# Patient Record
Sex: Female | Born: 1965 | Race: Black or African American | Hispanic: No | Marital: Married | Smoking: Never smoker
Health system: Southern US, Community
[De-identification: ages and names within clinical notes are randomized; demographics above are authoritative.]

## PROBLEM LIST (undated history)

## (undated) DIAGNOSIS — J302 Other seasonal allergic rhinitis: Secondary | ICD-10-CM

## (undated) DIAGNOSIS — E7800 Pure hypercholesterolemia, unspecified: Secondary | ICD-10-CM

## (undated) DIAGNOSIS — I10 Essential (primary) hypertension: Secondary | ICD-10-CM

## (undated) HISTORY — PX: ABDOMINAL HYSTERECTOMY: SHX81

## (undated) HISTORY — PX: TUBAL LIGATION: SHX77

---

## 2011-10-22 ENCOUNTER — Emergency Department (HOSPITAL_COMMUNITY)
Admission: EM | Admit: 2011-10-22 | Discharge: 2011-10-23 | Disposition: A | Discharge status: Home / Self Care | Payer: Self-pay | Attending: Emergency Medicine | Admitting: Emergency Medicine

## 2011-10-22 ENCOUNTER — Encounter (HOSPITAL_COMMUNITY): Payer: Self-pay | Admitting: Emergency Medicine

## 2011-10-22 DIAGNOSIS — Y9229 Other specified public building as the place of occurrence of the external cause: Secondary | ICD-10-CM | POA: Insufficient documentation

## 2011-10-22 DIAGNOSIS — E119 Type 2 diabetes mellitus without complications: Secondary | ICD-10-CM | POA: Insufficient documentation

## 2011-10-22 DIAGNOSIS — Z79899 Other long term (current) drug therapy: Secondary | ICD-10-CM | POA: Insufficient documentation

## 2011-10-22 DIAGNOSIS — IMO0002 Reserved for concepts with insufficient information to code with codable children: Secondary | ICD-10-CM | POA: Insufficient documentation

## 2011-10-22 DIAGNOSIS — E7800 Pure hypercholesterolemia, unspecified: Secondary | ICD-10-CM | POA: Insufficient documentation

## 2011-10-22 DIAGNOSIS — S0003XA Contusion of scalp, initial encounter: Secondary | ICD-10-CM | POA: Insufficient documentation

## 2011-10-22 DIAGNOSIS — Z794 Long term (current) use of insulin: Secondary | ICD-10-CM | POA: Insufficient documentation

## 2011-10-22 DIAGNOSIS — S1093XA Contusion of unspecified part of neck, initial encounter: Secondary | ICD-10-CM | POA: Insufficient documentation

## 2011-10-22 DIAGNOSIS — S0093XA Contusion of unspecified part of head, initial encounter: Secondary | ICD-10-CM

## 2011-10-22 HISTORY — DX: Pure hypercholesterolemia, unspecified: E78.00

## 2011-10-22 MED ORDER — HYDROCODONE-ACETAMINOPHEN 5-325 MG PO TABS
1.0000 | ORAL_TABLET | Freq: Once | ORAL | Status: AC
  Administered 2011-10-22: 1 via ORAL
  Filled 2011-10-22: qty 1

## 2011-10-22 NOTE — ED Notes (Signed)
PA to bedside.  Pt rolled off of board with PA and another RN while maintaining c-spine.  Pt remains in c-collar.

## 2011-10-22 NOTE — ED Provider Notes (Signed)
History     CSN: [id number]  Arrival date & time 10/22/11  2216   First MD Initiated Contact with Patient 10/22/11 2229      Chief Complaint  Patient presents with  . Head Injury    Pt was leaving store and hit head on metal gate.  Pt having pain in head.  Pt having rt shoulder pain and lt lower back pain as well.    (Consider location/radiation/quality/duration/timing/severity/associated sxs/prior treatment) Patient is a 46 y.o. female presenting with head injury. The history is provided by the patient.  Head Injury  The incident occurred 1 to 2 hours ago. She came to the ER via EMS. Injury mechanism: She walked into a storefront gate that was being lowered.  There was no loss of consciousness. There was no blood loss. The quality of the pain is described as sharp. The pain has been constant since the injury. Associated symptoms comments: She denies fall after hitting her head. No LOC, nausea. She complains of pain in the right side of her head going into her shoulder. . She was found conscious by EMS personnel. Treatment on the scene included a backboard and a c-collar.    Past Medical History  Diagnosis Date  . Diabetes mellitus   . High cholesterol     Past Surgical History  Procedure Date  . Abdominal hysterectomy     No family history on file.  History  Substance Use Topics  . Smoking status: Never Smoker   . Smokeless tobacco: Not on file  . Alcohol Use: No    OB History    Grav Para Term Preterm Abortions TAB SAB Ect Mult Living                  Review of Systems  Constitutional: Negative for fever and chills.  HENT: Negative.   Respiratory: Negative.   Cardiovascular: Negative.   Gastrointestinal: Negative.  Negative for abdominal pain.  Musculoskeletal: Negative.   Skin: Negative.   Neurological: Negative.     Allergies  Penicillins  Home Medications   Current Outpatient Rx  Name Route Sig Dispense Refill  . ESTRADIOL 1 MG PO TABS Oral Take 5  mg by mouth daily.    Marland Kitchen GLIPIZIDE 5 MG PO TABS Oral Take 5 mg by mouth daily.    . INSULIN LISPRO PROT & LISPRO (75-25) 100 UNIT/ML [place] SUSP Subcutaneous Inject 30 Units into the skin 2 (two) times daily with a meal.    . LISINOPRIL PO Oral Take 1 tablet by mouth daily.    Marland Kitchen SIMVASTATIN PO Oral Take 1 tablet by mouth daily.      BP 150/97  Pulse 93  Temp(Src) 97.9 F (36.6 C) (Oral)  Resp 28  SpO2 99%  Physical Exam  Constitutional: She appears well-developed and well-nourished.  HENT:  Head: Normocephalic and atraumatic.  Neck: Normal range of motion. Neck supple.       No cervical spinal tenderness to palpation. Mild right paraspinal tenderness without swelling.   Cardiovascular: Normal rate and regular rhythm.   Pulmonary/Chest: Effort normal and breath sounds normal. She exhibits no tenderness.  Abdominal: Soft. Bowel sounds are normal. There is no tenderness. There is no rebound and no guarding.  Musculoskeletal: Normal range of motion. She exhibits no edema.  Neurological: She is alert. No cranial nerve deficit.  Skin: Skin is warm and dry. No rash noted.  Psychiatric: She has a normal mood and affect.    ED Course  Procedures (  including critical care time)  Labs Reviewed - No data to display No results found.  Ct Head Wo Contrast  10/23/2011  *RADIOLOGY REPORT*  Clinical Data:  Pain after injury, striking head on a metal date.  CT HEAD WITHOUT CONTRAST CT CERVICAL SPINE WITHOUT CONTRAST  Technique:  Multidetector CT imaging of the head and cervical spine was performed following the standard protocol without intravenous contrast.  Multiplanar CT image reconstructions of the cervical spine were also generated.  Comparison:   None  CT HEAD  Findings: Ventricles and sulci appear symmetrical.  No mass effect or midline shift.  No ventricular dilatation.  No abnormal extra- axial fluid collections.  Gray-white matter junctions are distinct. Basal cisterns are not effaced.  No  evidence of acute intracranial hemorrhage.  No depressed skull fractures.  Visualized paranasal sinuses and mastoid air cells are not opacified.  Minimal mucosal membrane thickening in the left maxillary antrum.  IMPRESSION: No acute intracranial abnormality demonstrated.  CT CERVICAL SPINE  Findings: Normal alignment of the cervical vertebrae and facet joints.  Degenerative changes at C1-2.  Degenerative endplate hypertrophic changes at C3-4, C4-5, and C5-6 levels. Intervertebral disc space heights are mostly preserved.  No vertebral compression deformities.  No prevertebral soft tissue swelling.  Lateral masses of C1 appear symmetrical.  The odontoid process is intact.  No paraspinal soft tissue infiltration. Visualized cervical lymph nodes are not pathologically enlarged.  IMPRESSION: No displaced fractures identified.  Degenerative changes.  Original Report Authenticated By: Marlon Pel, M.D.   Ct Cervical Spine Wo Contrast  10/23/2011  *RADIOLOGY REPORT*  Clinical Data:  Pain after injury, striking head on a metal date.  CT HEAD WITHOUT CONTRAST CT CERVICAL SPINE WITHOUT CONTRAST  Technique:  Multidetector CT imaging of the head and cervical spine was performed following the standard protocol without intravenous contrast.  Multiplanar CT image reconstructions of the cervical spine were also generated.  Comparison:   None  CT HEAD  Findings: Ventricles and sulci appear symmetrical.  No mass effect or midline shift.  No ventricular dilatation.  No abnormal extra- axial fluid collections.  Gray-white matter junctions are distinct. Basal cisterns are not effaced.  No evidence of acute intracranial hemorrhage.  No depressed skull fractures.  Visualized paranasal sinuses and mastoid air cells are not opacified.  Minimal mucosal membrane thickening in the left maxillary antrum.  IMPRESSION: No acute intracranial abnormality demonstrated.  CT CERVICAL SPINE  Findings: Normal alignment of the cervical  vertebrae and facet joints.  Degenerative changes at C1-2.  Degenerative endplate hypertrophic changes at C3-4, C4-5, and C5-6 levels. Intervertebral disc space heights are mostly preserved.  No vertebral compression deformities.  No prevertebral soft tissue swelling.  Lateral masses of C1 appear symmetrical.  The odontoid process is intact.  No paraspinal soft tissue infiltration. Visualized cervical lymph nodes are not pathologically enlarged.  IMPRESSION: No displaced fractures identified.  Degenerative changes.  Original Report Authenticated By: Marlon Pel, M.D.   1. Contusion of head     Medical screening examination/treatment/procedure(s) were performed by non-physician practitioner and as supervising physician I was immediately available for consultation/collaboration. Osvaldo Human, M.D.      Rodena Medin, PA-C 10/23/11 0058  Carleene Cooper III, MD 10/23/11 [phone]

## 2011-10-23 ENCOUNTER — Emergency Department (HOSPITAL_COMMUNITY): Payer: Self-pay

## 2011-10-23 MED ORDER — IBUPROFEN 800 MG PO TABS: 800.0000 mg | ORAL_TABLET | Freq: Three times a day (TID) | ORAL | Status: AC

## 2011-10-23 MED ORDER — CYCLOBENZAPRINE HCL 10 MG PO TABS: 10.0000 mg | ORAL_TABLET | Freq: Two times a day (BID) | ORAL | Status: AC | PRN

## 2011-10-23 NOTE — ED Notes (Addendum)
Heard pt scream help from the hallway, upon entering the room pt was found to be hyperventilating, very anxious stating "I can't breath".  Ensured pt that her oxygn saturation was very good (100%) and instructed her on controlling her breathing. Radiology at bedside to take pt for xray.

## 2011-10-23 NOTE — ED Notes (Signed)
Pt report given and care endorsed to [place], RN 

## 2013-02-10 ENCOUNTER — Emergency Department (HOSPITAL_BASED_OUTPATIENT_CLINIC_OR_DEPARTMENT_OTHER)
Admission: EM | Admit: 2013-02-10 | Discharge: 2013-02-10 | Disposition: A | Discharge status: Home / Self Care | Payer: Self-pay | Attending: Emergency Medicine | Admitting: Emergency Medicine

## 2013-02-10 ENCOUNTER — Encounter (HOSPITAL_BASED_OUTPATIENT_CLINIC_OR_DEPARTMENT_OTHER): Payer: Self-pay | Admitting: *Deleted

## 2013-02-10 DIAGNOSIS — Z880 Allergy status to penicillin: Secondary | ICD-10-CM | POA: Insufficient documentation

## 2013-02-10 DIAGNOSIS — Z9119 Patient's noncompliance with other medical treatment and regimen: Secondary | ICD-10-CM | POA: Insufficient documentation

## 2013-02-10 DIAGNOSIS — R358 Other polyuria: Secondary | ICD-10-CM | POA: Insufficient documentation

## 2013-02-10 DIAGNOSIS — Z9114 Patient's other noncompliance with medication regimen: Secondary | ICD-10-CM

## 2013-02-10 DIAGNOSIS — R5383 Other fatigue: Secondary | ICD-10-CM | POA: Insufficient documentation

## 2013-02-10 DIAGNOSIS — R739 Hyperglycemia, unspecified: Secondary | ICD-10-CM

## 2013-02-10 DIAGNOSIS — Z794 Long term (current) use of insulin: Secondary | ICD-10-CM | POA: Insufficient documentation

## 2013-02-10 DIAGNOSIS — E7800 Pure hypercholesterolemia, unspecified: Secondary | ICD-10-CM | POA: Insufficient documentation

## 2013-02-10 DIAGNOSIS — R5381 Other malaise: Secondary | ICD-10-CM | POA: Insufficient documentation

## 2013-02-10 DIAGNOSIS — R3589 Other polyuria: Secondary | ICD-10-CM | POA: Insufficient documentation

## 2013-02-10 DIAGNOSIS — R109 Unspecified abdominal pain: Secondary | ICD-10-CM | POA: Insufficient documentation

## 2013-02-10 DIAGNOSIS — Z79899 Other long term (current) drug therapy: Secondary | ICD-10-CM | POA: Insufficient documentation

## 2013-02-10 DIAGNOSIS — R631 Polydipsia: Secondary | ICD-10-CM | POA: Insufficient documentation

## 2013-02-10 DIAGNOSIS — R110 Nausea: Secondary | ICD-10-CM | POA: Insufficient documentation

## 2013-02-10 DIAGNOSIS — Z90710 Acquired absence of both cervix and uterus: Secondary | ICD-10-CM | POA: Insufficient documentation

## 2013-02-10 DIAGNOSIS — R42 Dizziness and giddiness: Secondary | ICD-10-CM | POA: Insufficient documentation

## 2013-02-10 DIAGNOSIS — E1169 Type 2 diabetes mellitus with other specified complication: Secondary | ICD-10-CM | POA: Insufficient documentation

## 2013-02-10 DIAGNOSIS — I10 Essential (primary) hypertension: Secondary | ICD-10-CM | POA: Insufficient documentation

## 2013-02-10 DIAGNOSIS — Z9089 Acquired absence of other organs: Secondary | ICD-10-CM | POA: Insufficient documentation

## 2013-02-10 DIAGNOSIS — Z91199 Patient's noncompliance with other medical treatment and regimen due to unspecified reason: Secondary | ICD-10-CM | POA: Insufficient documentation

## 2013-02-10 HISTORY — DX: Essential (primary) hypertension: I10

## 2013-02-10 HISTORY — DX: Other seasonal allergic rhinitis: J30.2

## 2013-02-10 LAB — COMPREHENSIVE METABOLIC PANEL
ALT: 26 U/L (ref 0–35)
AST: 38 U/L — ABNORMAL HIGH (ref 0–37)
Albumin: 3.8 g/dL (ref 3.5–5.2)
Calcium: 10.2 mg/dL (ref 8.4–10.5)
Creatinine, Ser: 0.7 mg/dL (ref 0.50–1.10)
Sodium: 137 mEq/L (ref 135–145)
Total Protein: 8.3 g/dL (ref 6.0–8.3)

## 2013-02-10 LAB — GLUCOSE, CAPILLARY: Glucose-Capillary: 264 mg/dL — ABNORMAL HIGH (ref 70–99)

## 2013-02-10 LAB — URINE MICROSCOPIC-ADD ON

## 2013-02-10 LAB — URINALYSIS, ROUTINE W REFLEX MICROSCOPIC
Bilirubin Urine: NEGATIVE
Hgb urine dipstick: NEGATIVE
Specific Gravity, Urine: 1.036 — ABNORMAL HIGH (ref 1.005–1.030)
pH: 6.5 (ref 5.0–8.0)

## 2013-02-10 LAB — CBC
MCH: 29.4 pg (ref 26.0–34.0)
MCHC: 36.5 g/dL — ABNORMAL HIGH (ref 30.0–36.0)
MCV: 80.6 fL (ref 78.0–100.0)
Platelets: 243 10*3/uL (ref 150–400)
RBC: 4.69 MIL/uL (ref 3.87–5.11)
RDW: 13.2 % (ref 11.5–15.5)

## 2013-02-10 LAB — KETONES, QUALITATIVE: Acetone, Bld: NEGATIVE

## 2013-02-10 MED ORDER — INSULIN LISPRO PROT & LISPRO (75-25 MIX) 100 UNIT/ML ~~LOC~~ SUSP: 30.0000 [IU] | Freq: Two times a day (BID) | SUBCUTANEOUS | Status: DC

## 2013-02-10 MED ORDER — SODIUM CHLORIDE 0.9 % IV BOLUS (SEPSIS)
1000.0000 mL | Freq: Once | INTRAVENOUS | Status: AC
  Administered 2013-02-10: 1000 mL via INTRAVENOUS

## 2013-02-10 MED ORDER — GLIPIZIDE 5 MG PO TABS: 5.0000 mg | ORAL_TABLET | Freq: Every day | ORAL | Status: DC

## 2013-02-10 MED ORDER — KETOROLAC TROMETHAMINE 30 MG/ML IJ SOLN
INTRAMUSCULAR | Status: AC
  Administered 2013-02-10: 30 mg
  Filled 2013-02-10: qty 1

## 2013-02-10 NOTE — ED Provider Notes (Signed)
History     CSN: [id number]  Arrival date & time 02/10/13  1731   First MD Initiated Contact with Patient 02/10/13 1900      Chief Complaint  Patient presents with  . Hyperglycemia    (Consider location/radiation/quality/duration/timing/severity/associated sxs/prior treatment) Patient is a 47 y.o. female presenting with hyperglycemia. The history is provided by the patient. No language interpreter was used.  Hyperglycemia Severity:  Moderate Associated symptoms: abdominal pain, fatigue, increased thirst, nausea and polyuria   Associated symptoms: no chest pain, no fever, no shortness of breath and no vomiting   Associated symptoms comment:  She has been out of her diabetic medications including Glipizide and insulin for the last week.  She reports feeling "bad" for 3 weeks described as lightheaded, dizzy, tired. She has had excessive thirst and norturia. No fever. Nausea without vomiting. She has pain in the epigastric area that is mild and comes and goes. No cough.   Past Medical History  Diagnosis Date  . Diabetes mellitus   . High cholesterol   . Hypertension   . Seasonal allergies     Past Surgical History  Procedure Laterality Date  . Abdominal hysterectomy    . Tubal ligation      No family history on file.  History  Substance Use Topics  . Smoking status: Never Smoker   . Smokeless tobacco: Never Used  . Alcohol Use: No    OB History   Grav Para Term Preterm Abortions TAB SAB Ect Mult Living                  Review of Systems  Constitutional: Positive for fatigue. Negative for fever and chills.  HENT: Negative.  Negative for neck pain.   Respiratory: Negative.  Negative for shortness of breath.   Cardiovascular: Negative.  Negative for chest pain.  Gastrointestinal: Positive for nausea and abdominal pain. Negative for vomiting.  Endocrine: Positive for polydipsia and polyuria.  Musculoskeletal: Negative.  Negative for myalgias.  Skin: Negative.   Negative for rash.  Neurological: Positive for weakness and light-headedness.    Allergies  Penicillins  Home Medications   Current Outpatient Rx  Name  Route  Sig  Dispense  Refill  . glipiZIDE (GLUCOTROL) 5 MG tablet   Oral   Take 5 mg by mouth daily.         . insulin lispro protamine-insulin lispro (HUMALOG 75/25) (75-25) 100 UNIT/ML SUSP   Subcutaneous   Inject 30 Units into the skin 2 (two) times daily with a meal.         . LISINOPRIL PO   Oral   Take 1 tablet by mouth daily.         . Loratadine 10 MG CAPS   Oral   Take by mouth.         . estradiol (ESTRACE) 1 MG tablet   Oral   Take 5 mg by mouth daily.         Marland Kitchen SIMVASTATIN PO   Oral   Take 1 tablet by mouth daily.           BP 137/74  Pulse 87  Temp(Src) 98.2 F (36.8 C) (Oral)  Resp 20  Ht 5\' 4"  (1.626 m)  Wt 184 lb (83.462 kg)  BMI 31.57 kg/m2  SpO2 99%  Physical Exam  Constitutional: She is oriented to person, place, and time. She appears well-developed and well-nourished.  HENT:  Head: Normocephalic.  Neck: Normal range of motion. Neck supple.  Cardiovascular: Normal rate and regular rhythm.   Pulmonary/Chest: Effort normal and breath sounds normal.  Abdominal: Soft. Bowel sounds are normal. There is no tenderness. There is no rebound and no guarding.  Musculoskeletal: Normal range of motion.  Neurological: She is alert and oriented to person, place, and time.  Skin: Skin is warm and dry. No rash noted.  Psychiatric: She has a normal mood and affect.    ED Course  Procedures (including critical care time)  Labs Reviewed  GLUCOSE, CAPILLARY - Abnormal; Notable for the following:    Glucose-Capillary 478 (*)    All other components within normal limits  CBC - Abnormal; Notable for the following:    MCHC 36.5 (*)    All other components within normal limits  COMPREHENSIVE METABOLIC PANEL - Abnormal; Notable for the following:    Glucose, Bld 385 (*)    AST 38 (*)     Alkaline Phosphatase 180 (*)    All other components within normal limits  URINALYSIS, ROUTINE W REFLEX MICROSCOPIC - Abnormal; Notable for the following:    Specific Gravity, Urine 1.036 (*)    Glucose, UA >1000 (*)    All other components within normal limits  GLUCOSE, CAPILLARY - Abnormal; Notable for the following:    Glucose-Capillary 264 (*)    All other components within normal limits  KETONES, QUALITATIVE  URINE MICROSCOPIC-ADD ON   No results found.   No diagnosis found.  1. Hyperglycemia 2. Medication noncompliance.   MDM  Blood sugar responds well to IV fluids only. No evidence to suggest acidosis on lab studies. Patient is comfortable. Encourage medication compliance. Stable for discharge.         Arnoldo Hooker, PA-C 02/10/13 2130

## 2013-02-10 NOTE — ED Notes (Signed)
Patient ambulates to restroom with standby assist and without difficulty

## 2013-02-10 NOTE — ED Notes (Signed)
Pt reports she has been "feeling bad" for three weeks. Having dizzy spells. Checked blood sugar today and it was 504

## 2013-02-11 ENCOUNTER — Emergency Department (HOSPITAL_BASED_OUTPATIENT_CLINIC_OR_DEPARTMENT_OTHER): Payer: Self-pay

## 2013-02-11 ENCOUNTER — Emergency Department (HOSPITAL_BASED_OUTPATIENT_CLINIC_OR_DEPARTMENT_OTHER)
Admission: EM | Admit: 2013-02-11 | Discharge: 2013-02-11 | Disposition: A | Discharge status: Home / Self Care | Payer: Self-pay | Attending: Emergency Medicine | Admitting: Emergency Medicine

## 2013-02-11 ENCOUNTER — Encounter (HOSPITAL_BASED_OUTPATIENT_CLINIC_OR_DEPARTMENT_OTHER): Payer: Self-pay | Admitting: *Deleted

## 2013-02-11 DIAGNOSIS — Z794 Long term (current) use of insulin: Secondary | ICD-10-CM | POA: Insufficient documentation

## 2013-02-11 DIAGNOSIS — R739 Hyperglycemia, unspecified: Secondary | ICD-10-CM

## 2013-02-11 DIAGNOSIS — R51 Headache: Secondary | ICD-10-CM | POA: Insufficient documentation

## 2013-02-11 DIAGNOSIS — R519 Headache, unspecified: Secondary | ICD-10-CM

## 2013-02-11 DIAGNOSIS — I10 Essential (primary) hypertension: Secondary | ICD-10-CM | POA: Insufficient documentation

## 2013-02-11 DIAGNOSIS — E7800 Pure hypercholesterolemia, unspecified: Secondary | ICD-10-CM | POA: Insufficient documentation

## 2013-02-11 DIAGNOSIS — R42 Dizziness and giddiness: Secondary | ICD-10-CM | POA: Insufficient documentation

## 2013-02-11 DIAGNOSIS — Z79899 Other long term (current) drug therapy: Secondary | ICD-10-CM | POA: Insufficient documentation

## 2013-02-11 DIAGNOSIS — Z880 Allergy status to penicillin: Secondary | ICD-10-CM | POA: Insufficient documentation

## 2013-02-11 DIAGNOSIS — E1169 Type 2 diabetes mellitus with other specified complication: Secondary | ICD-10-CM | POA: Insufficient documentation

## 2013-02-11 LAB — CBC
Hemoglobin: 13.4 g/dL (ref 12.0–15.0)
MCH: 29.1 pg (ref 26.0–34.0)
MCHC: 36 g/dL (ref 30.0–36.0)
MCV: 80.9 fL (ref 78.0–100.0)
RBC: 4.6 MIL/uL (ref 3.87–5.11)

## 2013-02-11 LAB — BASIC METABOLIC PANEL
BUN: 11 mg/dL (ref 6–23)
CO2: 28 mEq/L (ref 19–32)
Calcium: 10.3 mg/dL (ref 8.4–10.5)
GFR calc non Af Amer: >90 mL/min (ref >90)
Glucose, Bld: 254 mg/dL — ABNORMAL HIGH (ref 70–99)
Potassium: 3.4 mEq/L — ABNORMAL LOW (ref 3.5–5.1)
Sodium: 138 mEq/L (ref 135–145)

## 2013-02-11 MED ORDER — MECLIZINE HCL 25 MG PO TABS: 25.0000 mg | ORAL_TABLET | Freq: Three times a day (TID) | ORAL | Status: DC | PRN

## 2013-02-11 MED ORDER — SODIUM CHLORIDE 0.9 % IV BOLUS (SEPSIS)
1000.0000 mL | Freq: Once | INTRAVENOUS | Status: AC
  Administered 2013-02-11: 1000 mL via INTRAVENOUS

## 2013-02-11 MED ORDER — KETOROLAC TROMETHAMINE 30 MG/ML IJ SOLN
30.0000 mg | Freq: Once | INTRAMUSCULAR | Status: AC
  Administered 2013-02-11: 30 mg via INTRAVENOUS
  Filled 2013-02-11: qty 1

## 2013-02-11 MED ORDER — MECLIZINE HCL 25 MG PO TABS
50.0000 mg | ORAL_TABLET | Freq: Once | ORAL | Status: AC
  Administered 2013-02-11: 50 mg via ORAL
  Filled 2013-02-11: qty 2

## 2013-02-11 NOTE — ED Notes (Addendum)
Pt seen and tx last night for same, reports increased BS , cbg  In triage 230

## 2013-02-11 NOTE — ED Notes (Signed)
Patient transported to CT 

## 2013-02-11 NOTE — ED Notes (Signed)
NP at bedside.

## 2013-02-11 NOTE — ED Provider Notes (Signed)
History     CSN: [id number]  Arrival date & time 02/11/13  1932   First MD Initiated Contact with Patient 02/11/13 2124      Chief Complaint  Patient presents with  . Hyperglycemia    (Consider location/radiation/quality/duration/timing/severity/associated sxs/prior treatment) HPI Comments: Pt state that she is having dizziness with the room spinning:pt states that she was seen last night for elevated blood sugar but the symptoms continued:pt states that she is not having cp or sob:pt states that she has also had a headache  Patient is a 47 y.o. female presenting with hyperglycemia. The history is provided by the patient. No language interpreter was used.  Hyperglycemia Blood sugar level PTA:  260 Severity:  Moderate Onset quality:  Gradual Timing:  Constant Progression:  Unchanged Diabetes status:  Controlled with insulin Relieved by:  Nothing Associated symptoms: dizziness     Past Medical History  Diagnosis Date  . Diabetes mellitus   . High cholesterol   . Hypertension   . Seasonal allergies     Past Surgical History  Procedure Laterality Date  . Abdominal hysterectomy    . Tubal ligation      History reviewed. No pertinent family history.  History  Substance Use Topics  . Smoking status: Never Smoker   . Smokeless tobacco: Never Used  . Alcohol Use: No    OB History   Grav Para Term Preterm Abortions TAB SAB Ect Mult Living                  Review of Systems  Constitutional: Negative.   Respiratory: Negative.   Cardiovascular: Negative.   Neurological: Positive for dizziness.    Allergies  Penicillins  Home Medications   Current Outpatient Rx  Name  Route  Sig  Dispense  Refill  . estradiol (ESTRACE) 1 MG tablet   Oral   Take 5 mg by mouth daily.         Marland Kitchen glipiZIDE (GLUCOTROL) 5 MG tablet   Oral   Take 1 tablet (5 mg total) by mouth daily.   30 tablet   1   . insulin lispro protamine-lispro (HUMALOG 75/25) (75-25) 100 UNIT/ML  SUSP   Subcutaneous   Inject 30 Units into the skin 2 (two) times daily with a meal.   10 mL   1   . LISINOPRIL PO   Oral   Take 1 tablet by mouth daily.         . Loratadine 10 MG CAPS   Oral   Take by mouth.         Marland Kitchen SIMVASTATIN PO   Oral   Take 1 tablet by mouth daily.           BP 167/103  Pulse 98  Temp(Src) 98.1 F (36.7 C) (Oral)  Resp 16  SpO2 98%  Physical Exam  Nursing note and vitals reviewed. Constitutional: She is oriented to person, place, and time. She appears well-developed and well-nourished.  HENT:  Head: Normocephalic and atraumatic.  Eyes: Conjunctivae and EOM are normal. Pupils are equal, round, and reactive to light.  Neck: Normal range of motion. Neck supple.  Cardiovascular: Normal rate and regular rhythm.   Pulmonary/Chest: Effort normal and breath sounds normal.  Musculoskeletal: Normal range of motion.  Neurological: She is alert and oriented to person, place, and time.  Skin: Skin is warm and dry.  Psychiatric: She has a normal mood and affect.    ED Course  Procedures (including critical care  time)  Labs Reviewed  GLUCOSE, CAPILLARY - Abnormal; Notable for the following:    Glucose-Capillary 230 (*)    All other components within normal limits  BASIC METABOLIC PANEL - Abnormal; Notable for the following:    Potassium 3.4 (*)    Glucose, Bld 254 (*)    All other components within normal limits  GLUCOSE, CAPILLARY - Abnormal; Notable for the following:    Glucose-Capillary 230 (*)    All other components within normal limits  CBC   Ct Head Wo Contrast  02/11/2013   *RADIOLOGY REPORT*  Clinical Data: Headache and dizziness  CT HEAD WITHOUT CONTRAST  Technique:  Contiguous axial images were obtained from the base of the skull through the vertex without contrast.  Comparison: 10/22/2011  Findings: No acute hemorrhage, acute infarction, or mass lesion is identified.  No midline shift.  No ventriculomegaly.  No skull fracture.   Mild ethmoid mucoperiosteal thickening noted.  IMPRESSION: No acute intracranial finding.   Original Report Authenticated By: Christiana Pellant, M.D.     1. Vertigo   2. Hyperglycemia   3. Headache       MDM  Pt dizziness has resolved:pt able to ambulate without any problem:will send home on meclizine       Teressa Lower, NP 02/11/13 2350

## 2013-02-11 NOTE — ED Provider Notes (Signed)
Medical screening examination/treatment/procedure(s) were performed by non-physician practitioner and as supervising physician I was immediately available for consultation/collaboration.   Dione Booze, MD 02/11/13 2352

## 2013-02-11 NOTE — ED Notes (Signed)
Pt got up OOB and was not dizzy.  Sts that the room used to start spinning when she layed back down and it did not this time.

## 2013-02-13 NOTE — ED Provider Notes (Signed)
Medical screening examination/treatment/procedure(s) were performed by non-physician practitioner and as supervising physician I was immediately available for consultation/collaboration.   Shelda Jakes, MD 02/13/13 [phone]

## 2015-11-08 ENCOUNTER — Emergency Department (HOSPITAL_BASED_OUTPATIENT_CLINIC_OR_DEPARTMENT_OTHER)
Admission: EM | Admit: 2015-11-08 | Discharge: 2015-11-08 | Disposition: A | Discharge status: Home / Self Care | Payer: Self-pay | Attending: Emergency Medicine | Admitting: Emergency Medicine

## 2015-11-08 ENCOUNTER — Encounter (HOSPITAL_BASED_OUTPATIENT_CLINIC_OR_DEPARTMENT_OTHER): Payer: Self-pay | Admitting: *Deleted

## 2015-11-08 DIAGNOSIS — J111 Influenza due to unidentified influenza virus with other respiratory manifestations: Secondary | ICD-10-CM | POA: Insufficient documentation

## 2015-11-08 DIAGNOSIS — Z880 Allergy status to penicillin: Secondary | ICD-10-CM | POA: Insufficient documentation

## 2015-11-08 DIAGNOSIS — I10 Essential (primary) hypertension: Secondary | ICD-10-CM | POA: Insufficient documentation

## 2015-11-08 DIAGNOSIS — E119 Type 2 diabetes mellitus without complications: Secondary | ICD-10-CM | POA: Insufficient documentation

## 2015-11-08 DIAGNOSIS — R5383 Other fatigue: Secondary | ICD-10-CM | POA: Insufficient documentation

## 2015-11-08 DIAGNOSIS — E7800 Pure hypercholesterolemia, unspecified: Secondary | ICD-10-CM | POA: Insufficient documentation

## 2015-11-08 DIAGNOSIS — H9209 Otalgia, unspecified ear: Secondary | ICD-10-CM | POA: Insufficient documentation

## 2015-11-08 DIAGNOSIS — Z7951 Long term (current) use of inhaled steroids: Secondary | ICD-10-CM | POA: Insufficient documentation

## 2015-11-08 DIAGNOSIS — R69 Illness, unspecified: Secondary | ICD-10-CM

## 2015-11-08 DIAGNOSIS — Z79899 Other long term (current) drug therapy: Secondary | ICD-10-CM | POA: Insufficient documentation

## 2015-11-08 DIAGNOSIS — M2550 Pain in unspecified joint: Secondary | ICD-10-CM | POA: Insufficient documentation

## 2015-11-08 DIAGNOSIS — Z794 Long term (current) use of insulin: Secondary | ICD-10-CM | POA: Insufficient documentation

## 2015-11-08 LAB — CBG MONITORING, ED: Glucose-Capillary: 181 mg/dL — ABNORMAL HIGH (ref 65–99)

## 2015-11-08 MED ORDER — FLUTICASONE PROPIONATE 50 MCG/ACT NA SUSP: 2.0000 | Freq: Every day | NASAL | Status: DC

## 2015-11-08 MED ORDER — ACETAMINOPHEN 325 MG PO TABS
650.0000 mg | ORAL_TABLET | Freq: Once | ORAL | Status: AC
  Administered 2015-11-08: 650 mg via ORAL
  Filled 2015-11-08: qty 2

## 2015-11-08 MED ORDER — CETIRIZINE HCL 10 MG PO TABS: 10.0000 mg | ORAL_TABLET | Freq: Every day | ORAL | Status: DC

## 2015-11-08 MED ORDER — NAPROXEN 250 MG PO TABS: 250.0000 mg | ORAL_TABLET | Freq: Two times a day (BID) | ORAL | Status: DC

## 2015-11-08 NOTE — ED Provider Notes (Signed)
CSN: [id number]     Arrival date & time 11/08/15  1401 History   First MD Initiated Contact with Patient 11/08/15 1553     Chief Complaint  Patient presents with  . Generalized Body Aches    Kiara Hobbs is a 50 y.o. female who presents to the ED complaining of flulike symptoms for the past week and a half. The patient reports that she was diagnosed with influenza A at an urgent care 1 week ago. She reports her generalized body aches, ear pressure, and runny nose have persisted. She reports her cough has improved and almost resolved. She does not understand why she is still having body aches and symptoms. She was provided with a Z-Pak at the urgent care and she is still not feeling better. She has taken nothing for treatment of her symptoms today. Patient denies fevers, shortness of breath, wheezing, abdominal pain, nausea, vomiting, diarrhea, rashes, sore throat, trouble swallowing, headache, neck pain, neck stiffness, or urinary symptoms.  The history is provided by the patient. No language interpreter was used.    Past Medical History  Diagnosis Date  . Diabetes mellitus   . High cholesterol   . Hypertension   . Seasonal allergies    Past Surgical History  Procedure Laterality Date  . Abdominal hysterectomy    . Tubal ligation     History reviewed. No pertinent family history. Social History  Substance Use Topics  . Smoking status: Never Smoker   . Smokeless tobacco: Never Used  . Alcohol Use: No   OB History    No data available     Review of Systems  Constitutional: Positive for fatigue. Negative for fever and chills.  HENT: Positive for congestion, ear pain, postnasal drip, rhinorrhea and sneezing. Negative for ear discharge, sore throat and trouble swallowing.   Eyes: Negative for visual disturbance.  Respiratory: Positive for cough. Negative for shortness of breath and wheezing.   Cardiovascular: Negative for chest pain.  Gastrointestinal: Negative for nausea,  vomiting, abdominal pain and diarrhea.  Genitourinary: Negative for dysuria and difficulty urinating.  Musculoskeletal: Positive for arthralgias. Negative for back pain, neck pain and neck stiffness.  Skin: Negative for rash.  Neurological: Negative for light-headedness and headaches.      Allergies  Penicillins  Home Medications   Prior to Admission medications   Medication Sig Start Date End Date Taking? Authorizing Provider  insulin NPH-regular Human (NOVOLIN 70/30) (70-30) 100 UNIT/ML injection Inject into the skin.   Yes Historical Provider, MD  metoprolol (LOPRESSOR) 50 MG tablet Take 50 mg by mouth 2 (two) times daily.   Yes Historical Provider, MD  pravastatin (PRAVACHOL) 10 MG tablet Take 10 mg by mouth daily.   Yes Historical Provider, MD  cetirizine (ZYRTEC ALLERGY) 10 MG tablet Take 1 tablet (10 mg total) by mouth daily. 11/08/15   Everlene Farrier, PA-C  fluticasone (FLONASE) 50 MCG/ACT nasal spray Place 2 sprays into both nostrils daily. 11/08/15   Everlene Farrier, PA-C  glipiZIDE (GLUCOTROL) 5 MG tablet Take 1 tablet (5 mg total) by mouth daily. 02/10/13   Elpidio Anis, PA-C  naproxen (NAPROSYN) 250 MG tablet Take 1 tablet (250 mg total) by mouth 2 (two) times daily with a meal. 11/08/15   Everlene Farrier, PA-C   BP 134/79 mmHg  Pulse 95  Temp(Src) 98.6 F (37 C) (Oral)  Resp 18  Ht  (1.626 m)  Wt 90.719 kg  BMI 34.31 kg/m2  SpO2 97% Physical Exam  Constitutional: She is  oriented to person, place, and time. She appears well-developed and well-nourished. No distress.  Nontoxic appearing.  HENT:  Head: Normocephalic and atraumatic.  Right Ear: External ear normal.  Left Ear: External ear normal.  Mouth/Throat: Oropharynx is clear and moist.  Mild middle ear effusion noted bilaterally. No TM erythema or loss of landmarks. No tonsillar hypertrophy or exudates. Boggy nasal turbinates noted bilaterally.  Eyes: Conjunctivae are normal. Pupils are equal, round, and  reactive to light. Right eye exhibits no discharge. Left eye exhibits no discharge.  Neck: Normal range of motion. Neck supple. No JVD present.  Cardiovascular: Normal rate, regular rhythm, normal heart sounds and intact distal pulses.  Exam reveals no gallop and no friction rub.   No murmur heard. Pulmonary/Chest: Effort normal and breath sounds normal. No respiratory distress. She has no wheezes. She has no rales.  Lungs clear to auscultation bilaterally.  Abdominal: Soft. There is no tenderness. There is no guarding.  Musculoskeletal: Normal range of motion. She exhibits no edema or tenderness.  Lymphadenopathy:    She has no cervical adenopathy.  Neurological: She is alert and oriented to person, place, and time. Coordination normal.  Skin: Skin is warm and dry. No rash noted. She is not diaphoretic. No erythema. No pallor.  Psychiatric: She has a normal mood and affect. Her behavior is normal.  Nursing note and vitals reviewed.   ED Course  Procedures (including critical care time) Labs Review Labs Reviewed  CBG MONITORING, ED - Abnormal; Notable for the following:    Glucose-Capillary 181 (*)    All other components within normal limits    Imaging Review No results found. I have personally reviewed and evaluated these lab results as part of my medical decision-making.   EKG Interpretation None      Filed Vitals:   11/08/15 1412  BP: 134/79  Pulse: 95  Temp: 98.6 F (37 C)  TempSrc: Oral  Resp: 18  Height:  (1.626 m)  Weight: 90.719 kg  SpO2: 97%     MDM   Meds given in ED:  Medications  acetaminophen (TYLENOL) tablet 650 mg (not administered)    New Prescriptions   CETIRIZINE (ZYRTEC ALLERGY) 10 MG TABLET    Take 1 tablet (10 mg total) by mouth daily.   FLUTICASONE (FLONASE) 50 MCG/ACT NASAL SPRAY    Place 2 sprays into both nostrils daily.   NAPROXEN (NAPROSYN) 250 MG TABLET    Take 1 tablet (250 mg total) by mouth 2 (two) times daily with a meal.     Final diagnoses:  Influenza-like illness   This is a 50 y.o. female who presents to the ED complaining of flulike symptoms for the past week and a half. The patient reports that she was diagnosed with influenza A at an urgent care 1 week ago. She reports her generalized body aches, ear pressure, and runny nose have persisted. She reports her cough has improved and almost resolved. She does not understand why she is still having body aches and symptoms. She was provided with a Z-Pak at the urgent care and she is still not feeling better. She has taken nothing for treatment of her symptoms today.  On exam the patient is afebrile nontoxic appearing. Her lungs are clear to auscultation bilaterally. She has mild middle ear effusion noted bilaterally. She has boggy nasal Bilaterally. Her abdomen is soft and nontender palpation. Blood sugar is 181. Patient is diabetic. Patient has continued symptoms of influenza A. She's been taking nothing  for treatment of her body aches. Her cough is resolved. Will discharge with prescriptions for Zyrtec, Flonase and naproxen. I discussed the expected course of influenza and treatment options. She does not meet criteria to receive Tamiflu at this time. I encouraged her to follow up closely with her primary care doctor. I encouraged her to push oral fluids. I advised the patient to follow-up with their primary care provider this week. I advised the patient to return to the emergency department with new or worsening symptoms or new concerns. The patient verbalized understanding and agreement with plan.       Everlene Farrier, PA-C 11/08/15 1622  Tilden Fossa, MD 11/09/15 1329

## 2015-11-08 NOTE — ED Notes (Signed)
Dx with flu 1 week ago.  Reports that now she's got a runny nose and generalized body aches.  Ambulatory.  Denies cough, denies sore throat.

## 2015-11-08 NOTE — Discharge Instructions (Signed)

## 2016-04-01 DIAGNOSIS — E119 Type 2 diabetes mellitus without complications: Secondary | ICD-10-CM

## 2016-06-14 ENCOUNTER — Emergency Department (HOSPITAL_BASED_OUTPATIENT_CLINIC_OR_DEPARTMENT_OTHER): Payer: Self-pay

## 2016-06-14 ENCOUNTER — Encounter (HOSPITAL_BASED_OUTPATIENT_CLINIC_OR_DEPARTMENT_OTHER): Payer: Self-pay | Admitting: *Deleted

## 2016-06-14 ENCOUNTER — Emergency Department (HOSPITAL_BASED_OUTPATIENT_CLINIC_OR_DEPARTMENT_OTHER)
Admission: EM | Admit: 2016-06-14 | Discharge: 2016-06-14 | Disposition: A | Discharge status: Home / Self Care | Payer: Self-pay | Attending: Emergency Medicine | Admitting: Emergency Medicine

## 2016-06-14 DIAGNOSIS — Z7984 Long term (current) use of oral hypoglycemic drugs: Secondary | ICD-10-CM | POA: Insufficient documentation

## 2016-06-14 DIAGNOSIS — Z79899 Other long term (current) drug therapy: Secondary | ICD-10-CM | POA: Insufficient documentation

## 2016-06-14 DIAGNOSIS — K5730 Diverticulosis of large intestine without perforation or abscess without bleeding: Secondary | ICD-10-CM | POA: Insufficient documentation

## 2016-06-14 DIAGNOSIS — Z794 Long term (current) use of insulin: Secondary | ICD-10-CM | POA: Insufficient documentation

## 2016-06-14 DIAGNOSIS — E1165 Type 2 diabetes mellitus with hyperglycemia: Secondary | ICD-10-CM | POA: Insufficient documentation

## 2016-06-14 DIAGNOSIS — R739 Hyperglycemia, unspecified: Secondary | ICD-10-CM

## 2016-06-14 DIAGNOSIS — I10 Essential (primary) hypertension: Secondary | ICD-10-CM | POA: Insufficient documentation

## 2016-06-14 DIAGNOSIS — R1084 Generalized abdominal pain: Secondary | ICD-10-CM

## 2016-06-14 LAB — CBC WITH DIFFERENTIAL/PLATELET
BASOS ABS: 0 10*3/uL (ref 0.0–0.1)
BASOS PCT: 0 %
EOS ABS: 0.1 10*3/uL (ref 0.0–0.7)
EOS PCT: 2 %
HCT: 36.5 % (ref 36.0–46.0)
Hemoglobin: 12.4 g/dL (ref 12.0–15.0)
Lymphocytes Relative: 37 %
Lymphs Abs: 2.6 10*3/uL (ref 0.7–4.0)
MCH: 28.2 pg (ref 26.0–34.0)
MCHC: 34 g/dL (ref 30.0–36.0)
MCV: 83 fL (ref 78.0–100.0)
Monocytes Absolute: 0.5 10*3/uL (ref 0.1–1.0)
Monocytes Relative: 8 %
Neutro Abs: 3.8 10*3/uL (ref 1.7–7.7)
Neutrophils Relative %: 53 %
PLATELETS: 197 10*3/uL (ref 150–400)
RBC: 4.4 MIL/uL (ref 3.87–5.11)
RDW: 13.2 % (ref 11.5–15.5)
WBC: 7 10*3/uL (ref 4.0–10.5)

## 2016-06-14 LAB — COMPREHENSIVE METABOLIC PANEL
ALT: 18 U/L (ref 14–54)
AST: 40 U/L (ref 15–41)
Albumin: 3.9 g/dL (ref 3.5–5.0)
Alkaline Phosphatase: 104 U/L (ref 38–126)
Anion gap: 8 (ref 5–15)
BUN: 9 mg/dL (ref 6–20)
CHLORIDE: 103 mmol/L (ref 101–111)
CO2: 28 mmol/L (ref 22–32)
CREATININE: 0.64 mg/dL (ref 0.44–1.00)
Calcium: 9.4 mg/dL (ref 8.9–10.3)
GFR calc non Af Amer: >60 mL/min (ref >60)
Glucose, Bld: 200 mg/dL — ABNORMAL HIGH (ref 65–99)
Potassium: 3.6 mmol/L (ref 3.5–5.1)
SODIUM: 139 mmol/L (ref 135–145)
Total Bilirubin: 0.7 mg/dL (ref 0.3–1.2)
Total Protein: 7.7 g/dL (ref 6.5–8.1)

## 2016-06-14 LAB — URINALYSIS, ROUTINE W REFLEX MICROSCOPIC
BILIRUBIN URINE: NEGATIVE
Glucose, UA: NEGATIVE mg/dL
HGB URINE DIPSTICK: NEGATIVE
Ketones, ur: NEGATIVE mg/dL
Leukocytes, UA: NEGATIVE
Nitrite: NEGATIVE
PROTEIN: NEGATIVE mg/dL
Specific Gravity, Urine: 1.009 (ref 1.005–1.030)
pH: 6 (ref 5.0–8.0)

## 2016-06-14 LAB — LIPASE, BLOOD: Lipase: 30 U/L (ref 11–51)

## 2016-06-14 MED ORDER — DIPHENHYDRAMINE HCL 25 MG PO CAPS: 25.0000 mg | ORAL_CAPSULE | Freq: Three times a day (TID) | ORAL | 0 refills | Status: DC | PRN

## 2016-06-14 MED ORDER — DOCUSATE SODIUM 100 MG PO CAPS: 100.0000 mg | ORAL_CAPSULE | Freq: Every day | ORAL | 0 refills | Status: AC

## 2016-06-14 MED ORDER — METOCLOPRAMIDE HCL 5 MG/ML IJ SOLN
10.0000 mg | Freq: Once | INTRAMUSCULAR | Status: AC
  Administered 2016-06-14: 10 mg via INTRAVENOUS
  Filled 2016-06-14: qty 2

## 2016-06-14 MED ORDER — METOCLOPRAMIDE HCL 10 MG PO TABS: 10.0000 mg | ORAL_TABLET | Freq: Three times a day (TID) | ORAL | 0 refills | Status: DC | PRN

## 2016-06-14 MED ORDER — IOPAMIDOL (ISOVUE-300) INJECTION 61%
100.0000 mL | Freq: Once | INTRAVENOUS | Status: AC | PRN
  Administered 2016-06-14: 100 mL via INTRAVENOUS

## 2016-06-14 MED ORDER — HALOPERIDOL LACTATE 5 MG/ML IJ SOLN
2.0000 mg | Freq: Once | INTRAMUSCULAR | Status: AC
  Administered 2016-06-14: 2 mg via INTRAVENOUS
  Filled 2016-06-14: qty 1

## 2016-06-14 MED ORDER — MORPHINE SULFATE (PF) 4 MG/ML IV SOLN
4.0000 mg | Freq: Once | INTRAVENOUS | Status: AC
  Administered 2016-06-14: 4 mg via INTRAVENOUS
  Filled 2016-06-14: qty 1

## 2016-06-14 MED ORDER — SODIUM CHLORIDE 0.9 % IV BOLUS (SEPSIS)
1000.0000 mL | Freq: Once | INTRAVENOUS | Status: AC
  Administered 2016-06-14: 1000 mL via INTRAVENOUS

## 2016-06-14 MED ORDER — DIPHENHYDRAMINE HCL 50 MG/ML IJ SOLN
25.0000 mg | Freq: Once | INTRAMUSCULAR | Status: AC
  Administered 2016-06-14: 25 mg via INTRAVENOUS
  Filled 2016-06-14: qty 1

## 2016-06-14 MED ORDER — KETOROLAC TROMETHAMINE 15 MG/ML IJ SOLN
15.0000 mg | Freq: Once | INTRAMUSCULAR | Status: AC
  Administered 2016-06-14: 15 mg via INTRAVENOUS
  Filled 2016-06-14: qty 1

## 2016-06-14 MED FILL — METOCLOPRAMIDE 10 MG TABLET: 10 | 10 days supply | Qty: 30 | Fill #0

## 2016-06-14 MED FILL — BANOPHEN 25 MG CAPSULE: 25 | 34 days supply | Qty: 100 | Fill #0

## 2016-06-14 MED FILL — DOK 100 MG SOFTGEL: 100 | 100 days supply | Qty: 100 | Fill #0

## 2016-06-14 NOTE — ED Notes (Signed)
Patient transported to CT 

## 2016-06-14 NOTE — ED Notes (Signed)
Pt sitting up in bed drinking contrast

## 2016-06-14 NOTE — ED Provider Notes (Signed)
[facility]   CSN: [id number] Arrival date & time: 06/14/16  0749     History   Chief Complaint Chief Complaint  Patient presents with  . Abdominal Pain    HPI Kiara Hobbs is a 50 y.o. female.  HPI 50 year old female with past medical history of hypertension, hyperlipidemia and diabetes who presents with a several day history of suprapubic and right flank pain. Patient states that she was seen at [facility] 10 days ago for similar symptoms. She was diagnosed with a UTI as well as IBS. She had a normal CT scan per report at that time. She states she finished her antibiotics and her urinary frequency improved but has returned over the last 3 days. Over the same time. She has had progressively worsening severe aching, gnawing right flank pain. Denies any associated fevers but has had some chills. She has also had nausea but no vomiting. No diarrhea or blood in her stools. No vaginal bleeding or discharge and she is status post hysterectomy. Denies any aggravating or alleviating factors.  Past Medical History:  Diagnosis Date  . Diabetes mellitus   . High cholesterol   . Hypertension   . Seasonal allergies     There are no active problems to display for this patient.   Past Surgical History:  Procedure Laterality Date  . ABDOMINAL HYSTERECTOMY    . TUBAL LIGATION      OB History    No data available       Home Medications    Prior to Admission medications   Medication Sig Start Date End Date Taking? Authorizing Provider  fluticasone (FLONASE) 50 MCG/ACT nasal spray Place 2 sprays into both nostrils daily. 11/08/15  Yes Everlene FarrierWilliam Dansie, PA-C  glipiZIDE (GLUCOTROL) 5 MG tablet Take 1 tablet (5 mg total) by mouth daily. 02/10/13  Yes Shari Upstill, PA-C  insulin NPH-regular Human (NOVOLIN 70/30) (70-30) 100 UNIT/ML injection Inject into the skin.   Yes Historical Provider, MD  metFORMIN (GLUCOPHAGE) 1000 MG tablet Take 1,000 mg by mouth 2  (two) times daily with a meal.   Yes Historical Provider, MD  metoprolol (LOPRESSOR) 50 MG tablet Take 50 mg by mouth 2 (two) times daily.   Yes Historical Provider, MD  pravastatin (PRAVACHOL) 10 MG tablet Take 10 mg by mouth daily.   Yes Historical Provider, MD  valsartan (DIOVAN) 80 MG tablet Take 80 mg by mouth daily.   Yes Historical Provider, MD  diphenhydrAMINE (BENADRYL) 25 mg capsule Take 1 capsule (25 mg total) by mouth every 8 (eight) hours as needed (with Reglan). 06/14/16   Shaune Pollackameron Nikea Settle, MD  docusate sodium (COLACE) 100 MG capsule Take 1 capsule (100 mg total) by mouth daily. 06/14/16 06/24/16  Shaune Pollackameron Dama Hedgepeth, MD  metoCLOPramide (REGLAN) 10 MG tablet Take 1 tablet (10 mg total) by mouth every 8 (eight) hours as needed for nausea or vomiting. 06/14/16   Shaune Pollackameron Brenyn Petrey, MD    Family History No family history on file.  Social History Social History  Substance Use Topics  . Smoking status: Never Smoker  . Smokeless tobacco: Never Used  . Alcohol use No     Allergies   Lisinopril and Penicillins   Review of Systems Review of Systems  Constitutional: Positive for chills and fatigue. Negative for fever.  HENT: Negative for congestion, rhinorrhea and sore throat.   Eyes: Negative for visual disturbance.  Respiratory: Negative for cough, shortness of breath and wheezing.   Cardiovascular: Negative for chest  pain and leg swelling.  Gastrointestinal: Positive for abdominal pain and nausea. Negative for diarrhea and vomiting.  Genitourinary: Positive for flank pain and frequency. Negative for dysuria, vaginal bleeding and vaginal discharge.  Musculoskeletal: Negative for neck pain.  Skin: Negative for rash.  Allergic/Immunologic: Negative for immunocompromised state.  Neurological: Negative for syncope and headaches.  Hematological: Does not bruise/bleed easily.  All other systems reviewed and are negative.    Physical Exam Updated Vital Signs BP (!) 166/107   Pulse 98    Temp 98.5 F (36.9 C) (Oral)   Resp 20   Ht 5\' 4"  (1.626 m)   Wt 194 lb (88 kg)   SpO2 95%   BMI 33.30 kg/m   Physical Exam  Constitutional: She is oriented to person, place, and time. She appears well-developed and well-nourished. No distress.  HENT:  Head: Normocephalic and atraumatic.  Eyes: Conjunctivae are normal.  Neck: Neck supple.  Cardiovascular: Normal rate, regular rhythm and normal heart sounds.  Exam reveals no friction rub.   No murmur heard. Pulmonary/Chest: Effort normal and breath sounds normal. No respiratory distress. She has no wheezes. She has no rales.  Abdominal: Soft. Bowel sounds are normal. She exhibits no distension and no mass. There is tenderness (Right CVA tenderness as well as right lower quadrant and suprapubic tenderness.). There is no rebound and no guarding.  Musculoskeletal: She exhibits no edema.  Neurological: She is alert and oriented to person, place, and time. She exhibits normal muscle tone.  Skin: Skin is warm. Capillary refill takes less than 2 seconds.  Psychiatric: She has a normal mood and affect.  Nursing note and vitals reviewed.    ED Treatments / Results  Labs (all labs ordered are listed, but only abnormal results are displayed) Labs Reviewed  COMPREHENSIVE METABOLIC PANEL - Abnormal; Notable for the following:       Result Value   Glucose, Bld 200 (*)    All other components within normal limits  URINALYSIS, ROUTINE W REFLEX MICROSCOPIC (NOT AT [facility])  CBC WITH DIFFERENTIAL/PLATELET  LIPASE, BLOOD  GC/CHLAMYDIA PROBE AMP ([place]) NOT AT [facility]    EKG  EKG Interpretation  Date/Time:  Tuesday June 14 2016 10:05:06 EDT Ventricular Rate:  94 PR Interval:    QRS Duration: 83 QT Interval:  372 QTC Calculation: 466 R Axis:   71 Text Interpretation:  Sinus rhythm No old tracing to compare No acute changes Confirmed by Erma Heritage MD, Gunnison Chahal [phone]) on 06/14/2016 10:18:57 AM       Radiology Ct Abdomen Pelvis W  Contrast  Result Date: 06/14/2016 CLINICAL DATA:  Severe right lower quadrant pain and right flank pain. Recent urinary tract infection 10 days ago. History of IBS. EXAM: CT ABDOMEN AND PELVIS WITH CONTRAST TECHNIQUE: Multidetector CT imaging of the abdomen and pelvis was performed using the standard protocol following bolus administration of intravenous contrast. CONTRAST:  ISOVUE-300 IOPAMIDOL (ISOVUE-300) INJECTION 61% COMPARISON:  06/01/2016 FINDINGS: Lower chest: Minor dependent bibasilar atelectasis. No lower lobe pneumonia, collapse or consolidation. Normal heart size. No pericardial or pleural effusion. Hepatobiliary: No focal liver abnormality is seen. No gallstones, gallbladder wall thickening, or biliary dilatation. Pancreas: Unremarkable. No pancreatic ductal dilatation or surrounding inflammatory changes. Spleen: Normal in size without focal abnormality. Adrenals/Urinary Tract: Adrenal glands are unremarkable. Kidneys are normal, without renal calculi, focal lesion, or hydronephrosis. Bladder is unremarkable. Stomach/Bowel: Negative for bowel obstruction, significant dilatation, ileus, or free air. No fluid collection or abscess. No ascites. Normal appendix demonstrated. Minor  sigmoid diverticulosis without acute inflammatory process. Vascular/Lymphatic: No significant vascular findings are present. No enlarged abdominal or pelvic lymph nodes. Reproductive: Status post hysterectomy. No adnexal masses. Other: No abdominal wall hernia or abnormality. No abdominopelvic ascites. Musculoskeletal: No acute or significant osseous findings. IMPRESSION: No acute intra-abdominal or pelvic finding. Normal appendix. Minor sigmoid diverticulosis without acute inflammation Remote hysterectomy Electronically Signed   By: Judie Petit.  Shick M.D.   On: 06/14/2016 11:09    Procedures Procedures (including critical care time)  Medications Ordered in ED Medications  sodium chloride 0.9 % bolus 1,000 mL (0 mLs  Intravenous Stopped 06/14/16 1013)  morphine 4 MG/ML injection 4 mg (4 mg Intravenous Given 06/14/16 0910)  iopamidol (ISOVUE-300) 61 % injection 100 mL (100 mLs Intravenous Contrast Given 06/14/16 1034)  haloperidol lactate (HALDOL) injection 2 mg (2 mg Intravenous Given 06/14/16 1018)  metoCLOPramide (REGLAN) injection 10 mg (10 mg Intravenous Given 06/14/16 1050)  diphenhydrAMINE (BENADRYL) injection 25 mg (25 mg Intravenous Given 06/14/16 1053)  ketorolac (TORADOL) 15 MG/ML injection 15 mg (15 mg Intravenous Given 06/14/16 1055)     Initial Impression / Assessment and Plan / ED Course  I have reviewed the triage vital signs and the nursing notes.  Pertinent labs & imaging results that were available during my care of the patient were reviewed by me and considered in my medical decision making (see chart for details).  Clinical Course    50 yo F with PMHx of HTN, HLD, DM2 who presents with suprapubic and right flank as well as RLQ pain. On arrival, pt hypertensive (baseline) but VS o/w WNL. Exam is as above. DDx includes: chronic abdominal pain, IBS, appendicitis, enteritis/colitis, appendicitis, UTI. She is s/p hysterectomy. Must also consider gastroparesis. Will check labs and give symptomatic control.  Labs reassuring with normal WBC, normal renal function and LFTs. On re-assessment, pt endorses persistent, 10/10 severe RLQ pain. Discussed management, imaging options with pt - she is concerned for appendicitis, undiagnosed intra-abdominal pathology. Will obtain CT as no prior records from [facility] can be found on Care Everywhere.  CT negative. Sx improved with haldol, reglan which is more c/w likely gastroparesis. Tolerating PO and VS remain stable. Will d/c with reglan, benadryl, colace for mild constipation and advise close BS control and PCP f/u. Pt in agreement.  Final Clinical Impressions(s) / ED Diagnoses   Final diagnoses:  Generalized abdominal pain  Hyperglycemia  Diverticulosis of  large intestine without hemorrhage    New Prescriptions Discharge Medication List as of 06/14/2016 11:22 AM    START taking these medications   Details  diphenhydrAMINE (BENADRYL) 25 mg capsule Take 1 capsule (25 mg total) by mouth every 8 (eight) hours as needed (with Reglan)., Starting Tue 06/14/2016, Print    docusate sodium (COLACE) 100 MG capsule Take 1 capsule (100 mg total) by mouth daily., Starting Tue 06/14/2016, Until Fri 06/24/2016, Print    metoCLOPramide (REGLAN) 10 MG tablet Take 1 tablet (10 mg total) by mouth every 8 (eight) hours as needed for nausea or vomiting., Starting Tue 06/14/2016, Print         Shaune Pollack, MD 06/14/16 2020

## 2016-06-14 NOTE — ED Triage Notes (Signed)
Was seen for UTI 10 days ago and [facility]. C/o right lower quad pain and around to right back. States she has had a recent CT. [facility]. States pain is no better after finishing antibiotics.

## 2016-09-05 ENCOUNTER — Encounter (HOSPITAL_BASED_OUTPATIENT_CLINIC_OR_DEPARTMENT_OTHER): Payer: Self-pay | Admitting: *Deleted

## 2016-09-05 ENCOUNTER — Emergency Department (HOSPITAL_BASED_OUTPATIENT_CLINIC_OR_DEPARTMENT_OTHER)
Admission: EM | Admit: 2016-09-05 | Discharge: 2016-09-05 | Disposition: A | Discharge status: Home / Self Care | Payer: Self-pay | Attending: Emergency Medicine | Admitting: Emergency Medicine

## 2016-09-05 DIAGNOSIS — I10 Essential (primary) hypertension: Secondary | ICD-10-CM | POA: Insufficient documentation

## 2016-09-05 DIAGNOSIS — E119 Type 2 diabetes mellitus without complications: Secondary | ICD-10-CM | POA: Insufficient documentation

## 2016-09-05 DIAGNOSIS — Z79899 Other long term (current) drug therapy: Secondary | ICD-10-CM | POA: Insufficient documentation

## 2016-09-05 DIAGNOSIS — Z794 Long term (current) use of insulin: Secondary | ICD-10-CM | POA: Insufficient documentation

## 2016-09-05 DIAGNOSIS — J069 Acute upper respiratory infection, unspecified: Secondary | ICD-10-CM

## 2016-09-05 DIAGNOSIS — B9789 Other viral agents as the cause of diseases classified elsewhere: Secondary | ICD-10-CM

## 2016-09-05 DIAGNOSIS — H66002 Acute suppurative otitis media without spontaneous rupture of ear drum, left ear: Secondary | ICD-10-CM

## 2016-09-05 MED ORDER — IBUPROFEN 400 MG PO TABS
400.0000 mg | ORAL_TABLET | ORAL | 0 refills | Status: DC | PRN
Start: 2016-09-05 — End: 2018-11-04

## 2016-09-05 MED ORDER — ACETAMINOPHEN 500 MG PO TABS: 1000.0000 mg | ORAL_TABLET | Freq: Four times a day (QID) | ORAL | 0 refills | Status: DC | PRN

## 2016-09-05 MED ORDER — KETOROLAC TROMETHAMINE 60 MG/2ML IM SOLN
60.0000 mg | Freq: Once | INTRAMUSCULAR | Status: AC
  Administered 2016-09-05: 60 mg via INTRAMUSCULAR
  Filled 2016-09-05: qty 2

## 2016-09-05 MED ORDER — CEFDINIR 300 MG PO CAPS: 300.0000 mg | ORAL_CAPSULE | Freq: Two times a day (BID) | ORAL | 0 refills | Status: AC

## 2016-09-05 NOTE — ED Provider Notes (Signed)
[facility] Provider Note   CSN: [id number] Arrival date & time: 09/05/16  1026     History   Chief Complaint Chief Complaint  Patient presents with  . URI    HPI Kiara Hobbs is a 50 y.o. female.  HPI   3 days of generalized weakness, congestion, headache, sore throat, ears aching, slightly, yellow mucus congestion, slight cough  Headache frontal, radiates to back of head, no hx of headaches, tried theraflu, able to sleep but wake up and same. Constant headache, started slowly got worse  Worked Friday night and Saturday evenings started feeling badly  Glucose under control 118  Past Medical History:  Diagnosis Date  . Diabetes mellitus   . High cholesterol   . Hypertension   . Seasonal allergies     There are no active problems to display for this patient.   Past Surgical History:  Procedure Laterality Date  . ABDOMINAL HYSTERECTOMY    . TUBAL LIGATION      OB History    No data available       Home Medications    Prior to Admission medications   Medication Sig Start Date End Date Taking? Authorizing Provider  acetaminophen (TYLENOL) 500 MG tablet Take 2 tablets (1,000 mg total) by mouth every 6 (six) hours as needed. 09/05/16   Alvira MondayErin Cleone Hulick, MD  cefdinir (OMNICEF) 300 MG capsule Take 1 capsule (300 mg total) by mouth 2 (two) times daily. 09/05/16 09/15/16  Alvira MondayErin Rosemary Mossbarger, MD  diphenhydrAMINE (BENADRYL) 25 mg capsule Take 1 capsule (25 mg total) by mouth every 8 (eight) hours as needed (with Reglan). 06/14/16   Shaune Pollackameron Isaacs, MD  fluticasone (FLONASE) 50 MCG/ACT nasal spray Place 2 sprays into both nostrils daily. 11/08/15   Everlene FarrierWilliam Dansie, PA-C  glipiZIDE (GLUCOTROL) 5 MG tablet Take 1 tablet (5 mg total) by mouth daily. 02/10/13   Elpidio AnisShari Upstill, PA-C  ibuprofen (ADVIL,MOTRIN) 400 MG tablet Take 1 tablet (400 mg total) by mouth every 4 (four) hours as needed for headache. 09/05/16   Alvira MondayErin Anapaula Severt, MD  insulin NPH-regular Human (NOVOLIN  70/30) (70-30) 100 UNIT/ML injection Inject into the skin.    Historical Provider, MD  metFORMIN (GLUCOPHAGE) 1000 MG tablet Take 1,000 mg by mouth 2 (two) times daily with a meal.    Historical Provider, MD  metoCLOPramide (REGLAN) 10 MG tablet Take 1 tablet (10 mg total) by mouth every 8 (eight) hours as needed for nausea or vomiting. 06/14/16   Shaune Pollackameron Isaacs, MD  metoprolol (LOPRESSOR) 50 MG tablet Take 50 mg by mouth 2 (two) times daily.    Historical Provider, MD  pravastatin (PRAVACHOL) 10 MG tablet Take 10 mg by mouth daily.    Historical Provider, MD  valsartan (DIOVAN) 80 MG tablet Take 80 mg by mouth daily.    Historical Provider, MD    Family History No family history on file.  Social History Social History  Substance Use Topics  . Smoking status: Never Smoker  . Smokeless tobacco: Never Used  . Alcohol use No     Allergies   Lisinopril and Penicillins   Review of Systems Review of Systems  Constitutional: Positive for fatigue. Negative for chills and fever.  HENT: Positive for congestion, ear pain, rhinorrhea and sore throat.   Eyes: Negative for visual disturbance.  Respiratory: Positive for cough. Negative for shortness of breath.   Cardiovascular: Negative for chest pain.  Gastrointestinal: Negative for abdominal pain, nausea and vomiting.  Genitourinary: Negative for difficulty urinating, dysuria  and frequency.  Musculoskeletal: Negative for back pain and neck pain.  Skin: Negative for rash.  Neurological: Positive for headaches. Negative for syncope.     Physical Exam Updated Vital Signs BP 152/99 (BP Location: Right Arm)   Pulse 90   Temp 98.1 F (36.7 C)   Resp 19   SpO2 98%   Physical Exam  Constitutional: She is oriented to person, place, and time. She appears well-developed and well-nourished. No distress.  HENT:  Head: Normocephalic and atraumatic.  Frontal sinus tenderness L TM with bulging, purulence Frontal sinus tenderness  Eyes:  Conjunctivae and EOM are normal.  Neck: Normal range of motion.  Cardiovascular: Normal rate, regular rhythm, normal heart sounds and intact distal pulses.  Exam reveals no gallop and no friction rub.   No murmur heard. Pulmonary/Chest: Effort normal and breath sounds normal. No respiratory distress. She has no wheezes. She has no rales.  Abdominal: Soft. She exhibits no distension. There is no tenderness. There is no guarding.  Musculoskeletal: She exhibits no edema or tenderness.  Neurological: She is alert and oriented to person, place, and time.  Skin: Skin is warm and dry. No rash noted. She is not diaphoretic. No erythema.  Nursing note and vitals reviewed.    ED Treatments / Results  Labs (all labs ordered are listed, but only abnormal results are displayed) Labs Reviewed - No data to display  EKG  EKG Interpretation None       Radiology No results found.  Procedures Procedures (including critical care time)  Medications Ordered in ED Medications  ketorolac (TORADOL) injection 60 mg (60 mg Intramuscular Given 09/05/16 1122)     Initial Impression / Assessment and Plan / ED Course  I have reviewed the triage vital signs and the nursing notes.  Pertinent labs & imaging results that were available during my care of the patient were reviewed by me and considered in my medical decision making (see chart for details).  Clinical Course    50yo female with history of DM, htn, hlpd presents with concern for cough, congestion, ear pain, headache for 3 days.   Headache began slowly, no trauma, no fevers, normal ROM of neck and [facility] or mening[facility].  Patient was given toradol for headache.  HA may be secondary to sinusitis.  Left ear shows signs of otitis media. No sign of otitis externa nor mastoiditis. Low suspicion for pneumonia.  Will treat with cefdinir, recommend ibuprofen/tylenol for pain. Patient discharged in stable condition with understanding  of reasons to return.     Final Clinical Impressions(s) / ED Diagnoses   Final diagnoses:  Viral URI with cough  Acute suppurative otitis media of left ear without spontaneous rupture of tympanic membrane, recurrence not specified    New Prescriptions Discharge Medication List as of 09/05/2016 11:19 AM    START taking these medications   Details  acetaminophen (TYLENOL) 500 MG tablet Take 2 tablets (1,000 mg total) by mouth every 6 (six) hours as needed., Starting Mon 09/05/2016, Print    cefdinir (OMNICEF) 300 MG capsule Take 1 capsule (300 mg total) by mouth 2 (two) times daily., Starting Mon 09/05/2016, Until Thu 09/15/2016, Print    ibuprofen (ADVIL,MOTRIN) 400 MG tablet Take 1 tablet (400 mg total) by mouth every 4 (four) hours as needed for headache., Starting Mon 09/05/2016, Print         Alvira MondayErin Madine Sarr, MD 09/05/16 2026

## 2016-09-05 NOTE — ED Triage Notes (Signed)
Patient c/o congestion, generalized body aches, bilateral ear pain, sore throat and head ache over the past week. She took theraflu that provided some relief.

## 2017-01-11 ENCOUNTER — Emergency Department (HOSPITAL_BASED_OUTPATIENT_CLINIC_OR_DEPARTMENT_OTHER)
Admission: EM | Admit: 2017-01-11 | Discharge: 2017-01-11 | Disposition: A | Discharge status: Home / Self Care | Payer: Self-pay | Attending: Emergency Medicine | Admitting: Emergency Medicine

## 2017-01-11 ENCOUNTER — Emergency Department (HOSPITAL_BASED_OUTPATIENT_CLINIC_OR_DEPARTMENT_OTHER): Payer: Self-pay

## 2017-01-11 ENCOUNTER — Encounter (HOSPITAL_BASED_OUTPATIENT_CLINIC_OR_DEPARTMENT_OTHER): Payer: Self-pay | Admitting: Emergency Medicine

## 2017-01-11 DIAGNOSIS — Z791 Long term (current) use of non-steroidal anti-inflammatories (NSAID): Secondary | ICD-10-CM | POA: Insufficient documentation

## 2017-01-11 DIAGNOSIS — R5383 Other fatigue: Secondary | ICD-10-CM

## 2017-01-11 DIAGNOSIS — N390 Urinary tract infection, site not specified: Secondary | ICD-10-CM | POA: Insufficient documentation

## 2017-01-11 DIAGNOSIS — I10 Essential (primary) hypertension: Secondary | ICD-10-CM | POA: Insufficient documentation

## 2017-01-11 DIAGNOSIS — Z7984 Long term (current) use of oral hypoglycemic drugs: Secondary | ICD-10-CM | POA: Insufficient documentation

## 2017-01-11 DIAGNOSIS — Z79899 Other long term (current) drug therapy: Secondary | ICD-10-CM | POA: Insufficient documentation

## 2017-01-11 DIAGNOSIS — E119 Type 2 diabetes mellitus without complications: Secondary | ICD-10-CM | POA: Insufficient documentation

## 2017-01-11 LAB — URINALYSIS, ROUTINE W REFLEX MICROSCOPIC
Bilirubin Urine: NEGATIVE
Hgb urine dipstick: NEGATIVE
KETONES UR: NEGATIVE mg/dL
NITRITE: NEGATIVE
PROTEIN: NEGATIVE mg/dL
Specific Gravity, Urine: 1.03 (ref 1.005–1.030)
pH: 7.5 (ref 5.0–8.0)

## 2017-01-11 LAB — I-STAT CHEM 8, ED
BUN: 10 mg/dL (ref 6–20)
Calcium, Ion: 1.27 mmol/L (ref 1.15–1.40)
Chloride: 101 mmol/L (ref 101–111)
Creatinine, Ser: 0.8 mg/dL (ref 0.44–1.00)
GLUCOSE: 178 mg/dL — AB (ref 65–99)
HEMATOCRIT: 42 % (ref 36.0–46.0)
Hemoglobin: 14.3 g/dL (ref 12.0–15.0)
POTASSIUM: 3.6 mmol/L (ref 3.5–5.1)
Sodium: 140 mmol/L (ref 135–145)
TCO2: 29 mmol/L (ref 0–100)

## 2017-01-11 LAB — CBG MONITORING, ED: GLUCOSE-CAPILLARY: 135 mg/dL — AB (ref 65–99)

## 2017-01-11 LAB — CBC
HCT: 40.4 % (ref 36.0–46.0)
Hemoglobin: 13.8 g/dL (ref 12.0–15.0)
MCH: 28 pg (ref 26.0–34.0)
MCHC: 34.2 g/dL (ref 30.0–36.0)
MCV: 81.9 fL (ref 78.0–100.0)
Platelets: 241 10*3/uL (ref 150–400)
RBC: 4.93 MIL/uL (ref 3.87–5.11)
RDW: 14 % (ref 11.5–15.5)
WBC: 7.2 10*3/uL (ref 4.0–10.5)

## 2017-01-11 LAB — URINALYSIS, MICROSCOPIC (REFLEX)

## 2017-01-11 LAB — D-DIMER, QUANTITATIVE: D-Dimer, Quant: 0.34 ug/mL-FEU (ref 0.00–0.50)

## 2017-01-11 LAB — TROPONIN I: Troponin I: <0.03 ng/mL (ref <0.03)

## 2017-01-11 LAB — I-STAT CG4 LACTIC ACID, ED: Lactic Acid, Venous: 1.57 mmol/L (ref 0.5–1.9)

## 2017-01-11 MED ORDER — NITROFURANTOIN MONOHYD MACRO 100 MG PO CAPS: 100.0000 mg | ORAL_CAPSULE | Freq: Two times a day (BID) | ORAL | 0 refills | Status: DC

## 2017-01-11 MED ORDER — IRBESARTAN 75 MG PO TABS
75.0000 mg | ORAL_TABLET | Freq: Every day | ORAL | Status: DC
  Filled 2017-01-11: qty 1

## 2017-01-11 MED ORDER — SODIUM CHLORIDE 0.9 % IV BOLUS (SEPSIS)
1000.0000 mL | Freq: Once | INTRAVENOUS | Status: AC
  Administered 2017-01-11: 1000 mL via INTRAVENOUS

## 2017-01-11 MED ORDER — METOPROLOL TARTRATE 50 MG PO TABS
50.0000 mg | ORAL_TABLET | Freq: Once | ORAL | Status: AC
  Administered 2017-01-11: 50 mg via ORAL
  Filled 2017-01-11: qty 1

## 2017-01-11 MED FILL — NITROFURANTOIN MONO-MCR 100: 100 | 7 days supply | Qty: 14 | Fill #0

## 2017-01-11 NOTE — Discharge Instructions (Addendum)
Please seek immediate care if you develop the following: There is severe back pain or lower abdominal pain.  You have a fever.  There is nausea or vomiting.  There is continued burning or discomfort with urination.      Get help right away if: You feel confused. Your vision is blurry. You feel faint or pass out. You have a severe headache. You have severe abdominal, pelvic, or back pain. You have chest pain, shortness of breath, or an irregular or fast heartbeat. You are unable to urinate or you urinate less than normal. You develop abnormal bleeding, such as bleeding from the rectum, vagina, nose, lungs, or nipples. You vomit blood. You have thoughts about harming yourself or committing suicide. You are worried that you might harm someone else.

## 2017-01-11 NOTE — ED Triage Notes (Signed)
Pt reports feeling very tired and very cold for 2 weeks. Pt also states mild runny nose, denies N/V/D. Pt is a diabetic and states her blood sugar has been running normal. Pt reports good appetite. Pt denies pain.

## 2017-01-11 NOTE — ED Provider Notes (Signed)
[facility]   CSN: [id number] Arrival date & time: 01/11/17  0934     History   Chief Complaint Chief Complaint  Patient presents with  . Fatigue    HPI Kiara Hobbs is a 51 y.o. female with past medical history of diabetes, hypertension, seasonal allergies. Patient presents to the emergency room with chief complaint of extreme fatigue. Patient states his been ongoing for the past 2 weeks. She has associated chills, constant fatigue, feeling cold all the time. Patient states she feels dehydrated. She has a history of allergies and has been taking Singulair but states that she has associated cough and nasal congestion. She denies any chest pain, shortness of breath. She has pain in her lower back but denies any urinary symptoms. She does have some mild nausea, which she attributes to her gastroparesis. She has been able to eat recently and has been drinking normally.  HPI  Past Medical History:  Diagnosis Date  . Diabetes mellitus   . High cholesterol   . Hypertension   . Seasonal allergies     There are no active problems to display for this patient.   Past Surgical History:  Procedure Laterality Date  . ABDOMINAL HYSTERECTOMY    . TUBAL LIGATION      OB History    No data available       Home Medications    Prior to Admission medications   Medication Sig Start Date End Date Taking? Authorizing Provider  glipiZIDE (GLUCOTROL) 5 MG tablet Take 1 tablet (5 mg total) by mouth daily. 02/10/13  Yes Shari Upstill, PA-C  insulin NPH-regular Human (NOVOLIN 70/30) (70-30) 100 UNIT/ML injection Inject into the skin.   Yes Historical Provider, MD  meloxicam (MOBIC) 15 MG tablet Take 15 mg by mouth daily.   Yes Historical Provider, MD  metFORMIN (GLUCOPHAGE) 1000 MG tablet Take 1,000 mg by mouth 2 (two) times daily with a meal.   Yes Historical Provider, MD  metoCLOPramide (REGLAN) 10 MG tablet Take 1 tablet (10 mg total) by mouth every 8 (eight) hours  as needed for nausea or vomiting. 06/14/16  Yes Shaune Pollack, MD  metoprolol (LOPRESSOR) 50 MG tablet Take 50 mg by mouth 2 (two) times daily.   Yes Historical Provider, MD  traMADol (ULTRAM) 50 MG tablet Take by mouth every 6 (six) hours as needed.   Yes Historical Provider, MD  valsartan (DIOVAN) 80 MG tablet Take 80 mg by mouth daily.   Yes Historical Provider, MD  acetaminophen (TYLENOL) 500 MG tablet Take 2 tablets (1,000 mg total) by mouth every 6 (six) hours as needed. 09/05/16   Alvira Monday, MD  diphenhydrAMINE (BENADRYL) 25 mg capsule Take 1 capsule (25 mg total) by mouth every 8 (eight) hours as needed (with Reglan). 06/14/16   Shaune Pollack, MD  fluticasone (FLONASE) 50 MCG/ACT nasal spray Place 2 sprays into both nostrils daily. 11/08/15   Everlene Farrier, PA-C  ibuprofen (ADVIL,MOTRIN) 400 MG tablet Take 1 tablet (400 mg total) by mouth every 4 (four) hours as needed for headache. 09/05/16   Alvira Monday, MD  pravastatin (PRAVACHOL) 10 MG tablet Take 10 mg by mouth daily.    Historical Provider, MD    Family History No family history on file.  Social History Social History  Substance Use Topics  . Smoking status: Never Smoker  . Smokeless tobacco: Never Used  . Alcohol use No     Allergies   Lisinopril and Penicillins   Review of  Systems Review of Systems  Ten systems reviewed and are negative for acute change, except as noted in the HPI.   Physical Exam Updated Vital Signs BP (!) 155/101   Pulse (!) 104   Temp 98.4 F (36.9 C) (Oral)   Resp (!) 21   Ht  (1.626 m)   Wt 88 kg   SpO2 100%   BMI 33.30 kg/m   Physical Exam  Physical Exam  Nursing note and vitals reviewed. Constitutional: She is oriented to person, place, and time. She appears well-developed and well-nourished. No distress.  HENT:  Head: Normocephalic and atraumatic.  Eyes: Conjunctivae normal and EOM are normal. Pupils are equal, round, and reactive to light. No scleral icterus.   Neck: Normal range of motion.  Cardiovascular: Normal rate, regular rhythm and normal heart sounds.  Exam reveals no gallop and no friction rub.   No murmur heard. Pulmonary/Chest: Effort normal and breath sounds normal. No respiratory distress.  Abdominal: Soft. Bowel sounds are normal. She exhibits no distension and no mass. There is no tenderness. There is no guarding.   Musculoskeletal: Bilateral lumbar paraspinal tenderness, no CVA tenderness.  Neurological: She is alert and oriented to person, place, and time.  Skin: Skin is warm and dry. She is not diaphoretic.    ED Treatments / Results  Labs (all labs ordered are listed, but only abnormal results are displayed) Labs Reviewed  URINALYSIS, ROUTINE W REFLEX MICROSCOPIC - Abnormal; Notable for the following:       Result Value   Glucose, UA >=500 (*)    Leukocytes, UA SMALL (*)    All other components within normal limits  URINALYSIS, MICROSCOPIC (REFLEX) - Abnormal; Notable for the following:    Bacteria, UA FEW (*)    Squamous Epithelial / LPF 0-5 (*)    All other components within normal limits  CBG MONITORING, ED - Abnormal; Notable for the following:    Glucose-Capillary 135 (*)    All other components within normal limits  I-STAT CHEM 8, ED - Abnormal; Notable for the following:    Glucose, Bld 178 (*)    All other components within normal limits  CBC  TROPONIN I  CBG MONITORING, ED  I-STAT CG4 LACTIC ACID, ED  I-STAT CG4 LACTIC ACID, ED    EKG  EKG Interpretation None       Radiology No results found.  Procedures Procedures (including critical care time)  Medications Ordered in ED Medications  irbesartan (AVAPRO) tablet 75 mg (not administered)  metoprolol (LOPRESSOR) tablet 50 mg (not administered)     Initial Impression / Assessment and Plan / ED Course  I have reviewed the triage vital signs and the nursing notes.  Pertinent labs & imaging results that were available during my care of the  patient were reviewed by me and considered in my medical decision making (see chart for details).  Clinical Course as of Jan 11 1658  Wed Jan 11, 2017  1137 Glucose-Capillary: Marland Kitchen 135 [AH]    Clinical Course User Index [AH] Arthor Captain, PA-C   Pt has been diagnosed with a UTI. Pt is afebrile, no CVA tenderness, normotensive, and denies N/V. Tachycardia resolved with fluids.  Pt to be dc home with antibiotics and instructions to follow up with PCP if symptoms persist.   Final Clinical Impressions(s) / ED Diagnoses   Final diagnoses:  Fatigue, unspecified type  Urinary tract infection without hematuria, site unspecified    New Prescriptions New Prescriptions   No  medications on file     Arthor Captain, PA-C 01/11/17 1700    Canary Brim Tegeler, MD 01/11/17 Rosamaria Lints

## 2018-04-02 IMAGING — CR DG CHEST 2V
2 series · 2 of 2 positions shown · non-contrast
Comparison: CT chest 03/12/2015.  PA and lateral chest 06/01/2016.

CLINICAL DATA: Cough, shortness of breath and chills for 2-3 weeks.

EXAM:
CHEST  2 VIEW

[w chest pa]
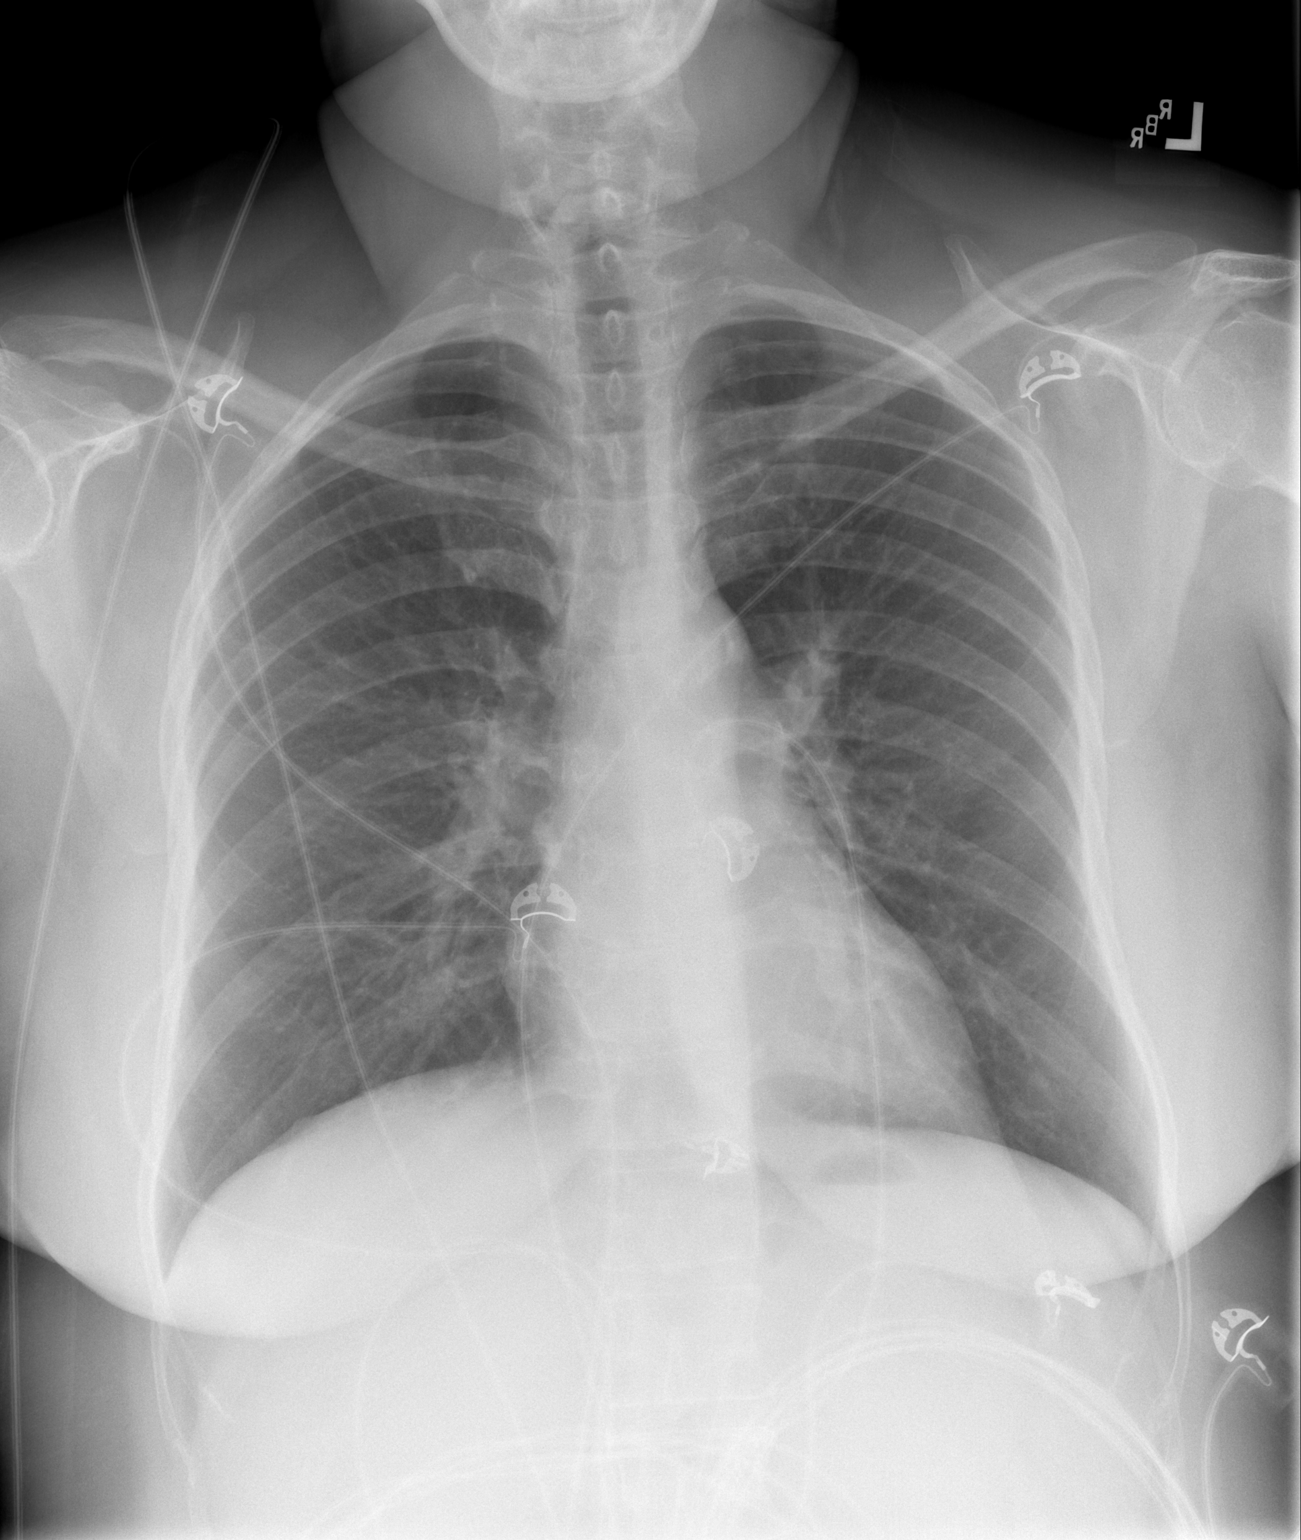

[w chest lat]
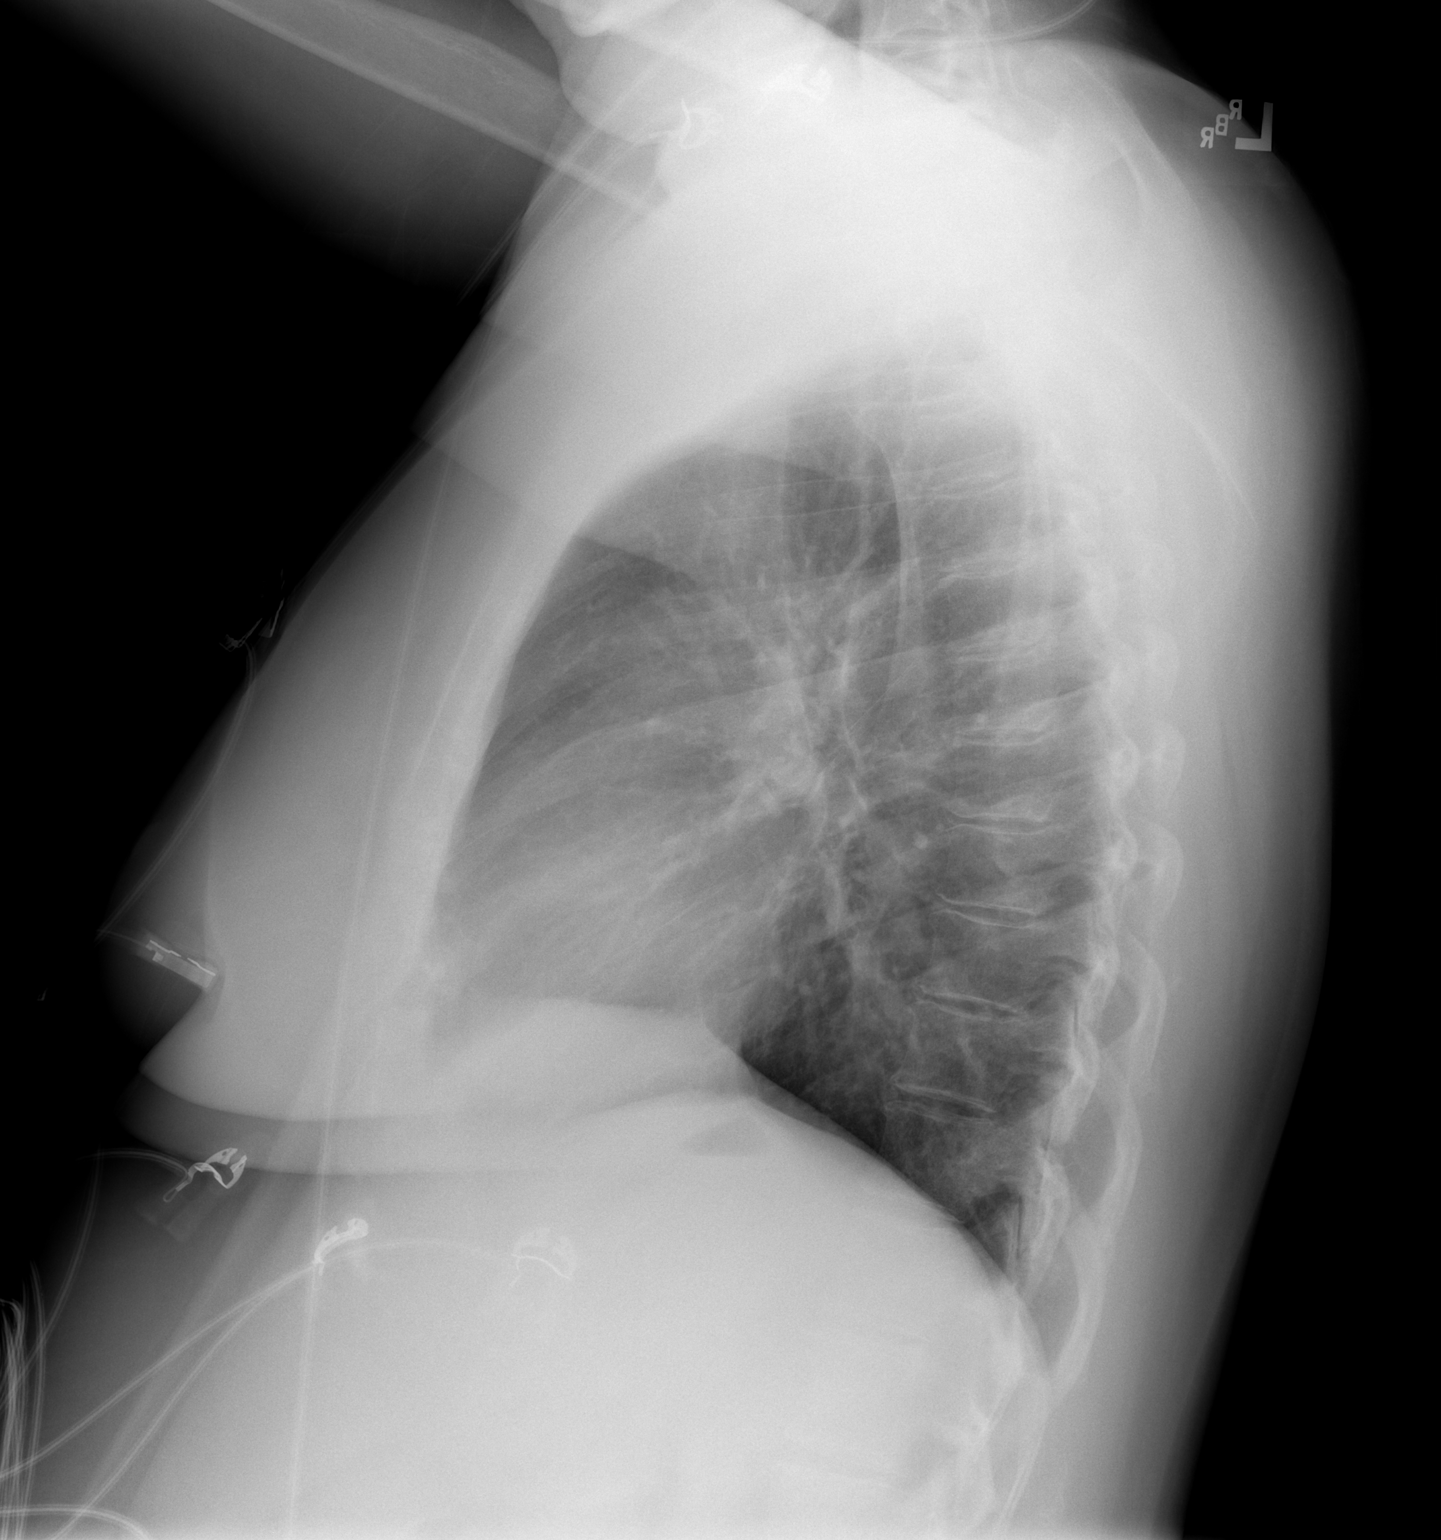

[2 of 2 positions shown; findings below may reference images not displayed]

FINDINGS: The lungs are clear. Heart size is normal. No pneumothorax or
pleural effusion. No bony abnormality.
IMPRESSION: Negative chest.

## 2018-05-27 ENCOUNTER — Emergency Department (HOSPITAL_BASED_OUTPATIENT_CLINIC_OR_DEPARTMENT_OTHER)
Admission: EM | Admit: 2018-05-27 | Discharge: 2018-05-27 | Disposition: A | Discharge status: Home / Self Care | Payer: Self-pay | Attending: Emergency Medicine | Admitting: Emergency Medicine

## 2018-05-27 ENCOUNTER — Encounter (HOSPITAL_BASED_OUTPATIENT_CLINIC_OR_DEPARTMENT_OTHER): Payer: Self-pay

## 2018-05-27 ENCOUNTER — Emergency Department (HOSPITAL_BASED_OUTPATIENT_CLINIC_OR_DEPARTMENT_OTHER): Payer: Self-pay

## 2018-05-27 ENCOUNTER — Other Ambulatory Visit: Payer: Self-pay

## 2018-05-27 DIAGNOSIS — Z79899 Other long term (current) drug therapy: Secondary | ICD-10-CM | POA: Insufficient documentation

## 2018-05-27 DIAGNOSIS — W010XXD Fall on same level from slipping, tripping and stumbling without subsequent striking against object, subsequent encounter: Secondary | ICD-10-CM | POA: Insufficient documentation

## 2018-05-27 DIAGNOSIS — R0781 Pleurodynia: Secondary | ICD-10-CM | POA: Insufficient documentation

## 2018-05-27 DIAGNOSIS — I10 Essential (primary) hypertension: Secondary | ICD-10-CM | POA: Insufficient documentation

## 2018-05-27 DIAGNOSIS — Z794 Long term (current) use of insulin: Secondary | ICD-10-CM | POA: Insufficient documentation

## 2018-05-27 DIAGNOSIS — E119 Type 2 diabetes mellitus without complications: Secondary | ICD-10-CM | POA: Insufficient documentation

## 2018-05-27 MED ORDER — HYDROCODONE-ACETAMINOPHEN 5-325 MG PO TABS: 1.0000 | ORAL_TABLET | Freq: Four times a day (QID) | ORAL | 0 refills | Status: DC | PRN

## 2018-05-27 NOTE — ED Triage Notes (Signed)
Pt states she fell on 9/4 and [facility] and was given an incentive spirometer. Pt states the pain still persists. Pt endorses a cough, denies fever.

## 2018-05-27 NOTE — ED Provider Notes (Signed)
[facility] EMERGENCY DEPARTMENT Provider Note   CSN: [id number] Arrival date & time: 05/27/18  1926     History   Chief Complaint No chief complaint on file.   HPI Kiara Hobbs is a 52 y.o. female.  HPI Patient is a 52 year old female presents the emergency department with right lateral inferior chest pain after a fall from ground-level on May 16, 2018.  She states she fell on the concrete under her right lateral chest.  She has ongoing pain.  She was seen in the ER and given incentive spirometer.  She states despite time she has had some ongoing discomfort and pain is worse with palpation of her right lower rib cage.  No fevers or chills.  No productive cough.  Denies abdominal pain.  Denies nausea vomiting diarrhea.  Symptoms are moderate in severity.  No improvement with ibuprofen and Tylenol and muscle relaxants.   Past Medical History:  Diagnosis Date  . Diabetes mellitus   . High cholesterol   . Hypertension   . Seasonal allergies     There are no active problems to display for this patient.   Past Surgical History:  Procedure Laterality Date  . ABDOMINAL HYSTERECTOMY    . TUBAL LIGATION       OB History   None      Home Medications    Prior to Admission medications   Medication Sig Start Date End Date Taking? Authorizing Provider  Dapagliflozin Propanediol (FARXIGA PO) Take by mouth.   Yes [provider]  pioglitazone (ACTOS) 15 MG tablet Take 15 mg by mouth daily.   Yes [provider]  telmisartan (MICARDIS) 20 MG tablet Take 20 mg by mouth daily.   Yes [provider]  acetaminophen (TYLENOL) 500 MG tablet Take 2 tablets (1,000 mg total) by mouth every 6 (six) hours as needed. 09/05/16   Alvira MondaySchlossman, Erin, MD  diphenhydrAMINE (BENADRYL) 25 mg capsule Take 1 capsule (25 mg total) by mouth every 8 (eight) hours as needed (with Reglan). 06/14/16   Shaune PollackIsaacs, Cameron, MD  fluticasone (FLONASE) 50 MCG/ACT nasal spray  Place 2 sprays into both nostrils daily. 11/08/15   Everlene Farrieransie, William, PA-C  glipiZIDE (GLUCOTROL) 5 MG tablet Take 1 tablet (5 mg total) by mouth daily. 02/10/13   Elpidio AnisUpstill, Shari, PA-C  HYDROcodone-acetaminophen (NORCO/VICODIN) 5-325 MG tablet Take 1 tablet by mouth every 6 (six) hours as needed for moderate pain. 05/27/18   Azalia Bilisampos, Curtis Cain, MD  ibuprofen (ADVIL,MOTRIN) 400 MG tablet Take 1 tablet (400 mg total) by mouth every 4 (four) hours as needed for headache. 09/05/16   Alvira MondaySchlossman, Erin, MD  insulin NPH-regular Human (NOVOLIN 70/30) (70-30) 100 UNIT/ML injection Inject into the skin.    [provider]  meloxicam (MOBIC) 15 MG tablet Take 15 mg by mouth daily.    [provider]  metFORMIN (GLUCOPHAGE) 1000 MG tablet Take 1,000 mg by mouth 2 (two) times daily with a meal.    [provider]  metoCLOPramide (REGLAN) 10 MG tablet Take 1 tablet (10 mg total) by mouth every 8 (eight) hours as needed for nausea or vomiting. 06/14/16   Shaune PollackIsaacs, Cameron, MD  metoprolol (LOPRESSOR) 50 MG tablet Take 50 mg by mouth 2 (two) times daily.    [provider]  nitrofurantoin, macrocrystal-monohydrate, (MACROBID) 100 MG capsule Take 1 capsule (100 mg total) by mouth 2 (two) times daily. X 7 days 01/11/17   Arthor CaptainHarris, Abigail, PA-C  pravastatin (PRAVACHOL) 10 MG tablet Take 10 mg by  mouth daily.    [provider]  traMADol (ULTRAM) 50 MG tablet Take by mouth every 6 (six) hours as needed.    [provider]  valsartan (DIOVAN) 80 MG tablet Take 80 mg by mouth daily.    [provider]    Family History No family history on file.  Social History Social History   Tobacco Use  . Smoking status: Never Smoker  . Smokeless tobacco: Never Used  Substance Use Topics  . Alcohol use: No  . Drug use: No     Allergies   Lisinopril and Penicillins   Review of Systems Review of Systems  All other systems reviewed and are negative.    Physical  Exam Updated Vital Signs BP 108/87 (BP Location: Left Arm)   Pulse 98   Temp 98.2 F (36.8 C) (Oral)   Resp 18   Ht 5\' 4"  (1.626 m)   Wt 80.7 kg   SpO2 96%   BMI 30.55 kg/m   Physical Exam  Constitutional: She is oriented to person, place, and time. She appears well-developed and well-nourished.  HENT:  Head: Normocephalic.  Eyes: EOM are normal.  Neck: Normal range of motion.  Cardiovascular: Normal rate and regular rhythm.  Pulmonary/Chest: Effort normal and breath sounds normal.  Tenderness of the right anterior lateral and right anterior 11th and 12th ribs without obvious bruising or overlying skin changes.  Abdominal: She exhibits no distension. There is no tenderness.  Musculoskeletal: Normal range of motion.  Neurological: She is alert and oriented to person, place, and time.  Psychiatric: She has a normal mood and affect.  Nursing note and vitals reviewed.    ED Treatments / Results  Labs (all labs ordered are listed, but only abnormal results are displayed) Labs Reviewed - No data to display  EKG None  Radiology Dg Chest 2 View  Result Date: 05/27/2018 CLINICAL DATA:  Fall with right-sided rib pain EXAM: CHEST - 2 VIEW COMPARISON:  01/11/2017 FINDINGS: No acute opacity or pleural effusion. Cardiomediastinal silhouette within normal limits with aortic atherosclerosis. No pneumothorax. IMPRESSION: No active cardiopulmonary disease. Electronically Signed   By: Jasmine Pang M.D.   On: 05/27/2018 20:20    Procedures Procedures (including critical care time)  Medications Ordered in ED Medications - No data to display   Initial Impression / Assessment and Plan / ED Course  I have reviewed the triage vital signs and the nursing notes.  Pertinent labs & imaging results that were available during my care of the patient were reviewed by me and considered in my medical decision making (see chart for details).     Musculoskeletal chest wall pain.  May represent  occult rib fracture and/or healing rib fracture not seen on today's x-ray.  Discharged home with short course of pain medication and primary care follow-up.  Final Clinical Impressions(s) / ED Diagnoses   Final diagnoses:  Rib pain on right side    ED Discharge Orders         Ordered    HYDROcodone-acetaminophen (NORCO/VICODIN) 5-325 MG tablet  Every 6 hours PRN     05/27/18 2051           Azalia Bilis, MD 05/27/18 2054

## 2018-11-04 ENCOUNTER — Encounter (HOSPITAL_COMMUNITY): Payer: Self-pay | Admitting: Emergency Medicine

## 2018-11-04 ENCOUNTER — Emergency Department (HOSPITAL_COMMUNITY)
Admission: EM | Admit: 2018-11-04 | Discharge: 2018-11-04 | Disposition: A | Discharge status: Home / Self Care | Payer: Self-pay | Attending: Emergency Medicine | Admitting: Emergency Medicine

## 2018-11-04 DIAGNOSIS — Z7984 Long term (current) use of oral hypoglycemic drugs: Secondary | ICD-10-CM | POA: Insufficient documentation

## 2018-11-04 DIAGNOSIS — I10 Essential (primary) hypertension: Secondary | ICD-10-CM | POA: Insufficient documentation

## 2018-11-04 DIAGNOSIS — E860 Dehydration: Secondary | ICD-10-CM | POA: Insufficient documentation

## 2018-11-04 DIAGNOSIS — E1165 Type 2 diabetes mellitus with hyperglycemia: Secondary | ICD-10-CM | POA: Insufficient documentation

## 2018-11-04 DIAGNOSIS — R739 Hyperglycemia, unspecified: Secondary | ICD-10-CM

## 2018-11-04 DIAGNOSIS — Z9114 Patient's other noncompliance with medication regimen: Secondary | ICD-10-CM | POA: Insufficient documentation

## 2018-11-04 DIAGNOSIS — Z79899 Other long term (current) drug therapy: Secondary | ICD-10-CM | POA: Insufficient documentation

## 2018-11-04 LAB — CBC
HCT: 38.4 % (ref 36.0–46.0)
Hemoglobin: 12.6 g/dL (ref 12.0–15.0)
MCH: 28 pg (ref 26.0–34.0)
MCHC: 32.8 g/dL (ref 30.0–36.0)
MCV: 85.3 fL (ref 80.0–100.0)
NRBC: 0 % (ref 0.0–0.2)
PLATELETS: 194 10*3/uL (ref 150–400)
RBC: 4.5 MIL/uL (ref 3.87–5.11)
RDW: 12.7 % (ref 11.5–15.5)
WBC: 5.4 10*3/uL (ref 4.0–10.5)

## 2018-11-04 LAB — URINALYSIS, ROUTINE W REFLEX MICROSCOPIC
BILIRUBIN URINE: NEGATIVE
Bacteria, UA: NONE SEEN
Glucose, UA: >=500 mg/dL — AB
Hgb urine dipstick: NEGATIVE
KETONES UR: NEGATIVE mg/dL
NITRITE: NEGATIVE
PH: 7 (ref 5.0–8.0)
Protein, ur: NEGATIVE mg/dL
Specific Gravity, Urine: 1.024 (ref 1.005–1.030)

## 2018-11-04 LAB — BASIC METABOLIC PANEL
Anion gap: 13 (ref 5–15)
BUN: 20 mg/dL (ref 6–20)
CALCIUM: 9.7 mg/dL (ref 8.9–10.3)
CHLORIDE: 92 mmol/L — AB (ref 98–111)
CO2: 23 mmol/L (ref 22–32)
CREATININE: 1.07 mg/dL — AB (ref 0.44–1.00)
Glucose, Bld: 851 mg/dL (ref 70–99)
Potassium: 4.6 mmol/L (ref 3.5–5.1)
Sodium: 128 mmol/L — ABNORMAL LOW (ref 135–145)

## 2018-11-04 LAB — CBG MONITORING, ED
Glucose-Capillary: >600 mg/dL (ref 70–99)
Glucose-Capillary: 542 mg/dL (ref 70–99)
Glucose-Capillary: 374 mg/dL — ABNORMAL HIGH (ref 70–99)

## 2018-11-04 MED ORDER — SODIUM CHLORIDE 0.9 % IV BOLUS
1000.0000 mL | Freq: Once | INTRAVENOUS | Status: AC
  Administered 2018-11-04: 1000 mL via INTRAVENOUS

## 2018-11-04 MED ORDER — DULAGLUTIDE 1.5 MG/0.5ML ~~LOC~~ SOAJ: 1.5000 mg | SUBCUTANEOUS | 1 refills | Status: DC

## 2018-11-04 MED ORDER — DAPAGLIFLOZIN PROPANEDIOL 10 MG PO TABS: 10.0000 mg | ORAL_TABLET | Freq: Every day | ORAL | 1 refills | Status: DC

## 2018-11-04 MED ORDER — PIOGLITAZONE HCL 30 MG PO TABS
30.0000 mg | ORAL_TABLET | Freq: Once | ORAL | Status: AC
  Administered 2018-11-04: 30 mg via ORAL
  Filled 2018-11-04: qty 1

## 2018-11-04 MED ORDER — INSULIN ASPART 100 UNIT/ML ~~LOC~~ SOLN
12.0000 [IU] | Freq: Once | SUBCUTANEOUS | Status: AC
  Administered 2018-11-04: 12 [IU] via INTRAVENOUS

## 2018-11-04 MED ORDER — METFORMIN HCL 500 MG PO TABS: 1000.0000 mg | ORAL_TABLET | Freq: Once | ORAL | Status: DC

## 2018-11-04 MED ORDER — PIOGLITAZONE HCL 15 MG PO TABS: 30.0000 mg | ORAL_TABLET | Freq: Every day | ORAL | 0 refills | Status: DC

## 2018-11-04 NOTE — ED Notes (Signed)
Pt ambulates independently to bathroom without difficulty. Denies dizziness but states that she is "more tired and weak than usual."

## 2018-11-04 NOTE — ED Provider Notes (Signed)
[facility] EMERGENCY DEPARTMENT Provider Note   CSN: [id number] Arrival date & time: 11/04/18  0720    History   Chief Complaint Chief Complaint  Patient presents with  . Hyperglycemia    HPI Kiara Hobbs is a 53 y.o. female.     HPI Patient is a 53 year old female presents to the emergency department with complaints of lightheadedness today while driving at work.  She has a history of diabetes.  She is been out of her diabetic medications for approximately 2 months.  Her blood sugar this morning read high.  She is continue to take her metformin but has been out of her other medications.  She states she is having an issue with her insurance.  She currently has health insurance but does not have her insurance card and is waiting for new insurance card in the mail.  She denies dietary indiscretion.  She is on 1000 mg extended release metformin daily and has continued to take that.  Feels like she is otherwise eating and drinking normally.  No fevers.  No chest pain.  No palpitations.  No history of syncope.  Denies abdominal pain.  Denies nausea vomiting diarrhea.  Reports some urinary frequency without dysuria.  She is thirsty at this time.   Past Medical History:  Diagnosis Date  . Diabetes mellitus   . High cholesterol   . Hypertension   . Seasonal allergies     There are no active problems to display for this patient.   Past Surgical History:  Procedure Laterality Date  . ABDOMINAL HYSTERECTOMY    . TUBAL LIGATION       OB History   No obstetric history on file.      Home Medications    Prior to Admission medications   Medication Sig Start Date End Date Taking? Authorizing Provider  atorvastatin (LIPITOR) 40 MG tablet Take 40 mg by mouth daily. 09/12/18  Yes [provider]  Dapagliflozin Propanediol (FARXIGA PO) Take by mouth.   Yes [provider]  Dulaglutide 1.5 MG/0.5ML SOPN Inject 1.5 mg into the skin once a week.  04/26/17  Yes [provider]  fenofibrate 160 MG tablet Take 160 mg by mouth daily. 10/14/18  Yes [provider]  losartan (COZAAR) 25 MG tablet Take 25 mg by mouth daily. 09/18/18  Yes [provider]  metFORMIN (GLUCOPHAGE) 1000 MG tablet Take 1,000 mg by mouth 2 (two) times daily with a meal.   Yes [provider]  metoprolol (LOPRESSOR) 50 MG tablet Take 50 mg by mouth 2 (two) times daily.   Yes [provider]  pioglitazone (ACTOS) 15 MG tablet Take 15 mg by mouth daily.   Yes [provider]  acetaminophen (TYLENOL) 500 MG tablet Take 2 tablets (1,000 mg total) by mouth every 6 (six) hours as needed. Patient not taking: Reported on 11/04/2018 09/05/16   Alvira Monday, MD  diphenhydrAMINE (BENADRYL) 25 mg capsule Take 1 capsule (25 mg total) by mouth every 8 (eight) hours as needed (with Reglan). Patient not taking: Reported on 11/04/2018 06/14/16   Shaune Pollack, MD  fluticasone [facility]) 50 MCG/ACT nasal spray Place 2 sprays into both nostrils daily. Patient not taking: Reported on 11/04/2018 11/08/15   Everlene Farrier, PA-C  glipiZIDE (GLUCOTROL) 5 MG tablet Take 1 tablet (5 mg total) by mouth daily. Patient not taking: Reported on 11/04/2018 02/10/13   Elpidio Anis, PA-C  HYDROcodone-acetaminophen (NORCO/VICODIN) 5-325 MG tablet Take 1 tablet by mouth every 6 (  six) hours as needed for moderate pain. Patient not taking: Reported on 11/04/2018 05/27/18   Azalia Bilis, MD  ibuprofen (ADVIL,MOTRIN) 400 MG tablet Take 1 tablet (400 mg total) by mouth every 4 (four) hours as needed for headache. Patient not taking: Reported on 11/04/2018 09/05/16   Alvira Monday, MD  metoCLOPramide (REGLAN) 10 MG tablet Take 1 tablet (10 mg total) by mouth every 8 (eight) hours as needed for nausea or vomiting. Patient not taking: Reported on 11/04/2018 06/14/16   Shaune Pollack, MD    Family History No family history on file.  Social History Social  History   Tobacco Use  . Smoking status: Never Smoker  . Smokeless tobacco: Never Used  Substance Use Topics  . Alcohol use: No  . Drug use: No     Allergies   Penicillins and Lisinopril   Review of Systems Review of Systems  All other systems reviewed and are negative.    Physical Exam Updated Vital Signs BP (!) 147/93   Pulse 79   Temp 97.6 F (36.4 C) (Oral)   Resp 16   Ht  (1.626 m)   Wt 83 kg   SpO2 97%   BMI 31.41 kg/m   Physical Exam Vitals signs and nursing note reviewed.  Constitutional:      General: She is not in acute distress.    Appearance: She is well-developed.  HENT:     Head: Normocephalic and atraumatic.  Neck:     Musculoskeletal: Normal range of motion.  Cardiovascular:     Rate and Rhythm: Normal rate and regular rhythm.     Heart sounds: Normal heart sounds.  Pulmonary:     Effort: Pulmonary effort is normal.     Breath sounds: Normal breath sounds.  Abdominal:     General: There is no distension.     Palpations: Abdomen is soft.     Tenderness: There is no abdominal tenderness.  Musculoskeletal: Normal range of motion.  Skin:    General: Skin is warm and dry.  Neurological:     Mental Status: She is alert and oriented to person, place, and time.  Psychiatric:        Judgment: Judgment normal.      ED Treatments / Results  Labs (all labs ordered are listed, but only abnormal results are displayed) Labs Reviewed  BASIC METABOLIC PANEL - Abnormal; Notable for the following components:      Result Value   Sodium 128 (*)    Chloride 92 (*)    Glucose, Bld 851 (*)    Creatinine, Ser 1.07 (*)    All other components within normal limits  URINALYSIS, ROUTINE W REFLEX MICROSCOPIC - Abnormal; Notable for the following components:   Color, Urine STRAW (*)    Glucose, UA >=500 (*)    Leukocytes,Ua SMALL (*)    All other components within normal limits  CBG MONITORING, ED - Abnormal; Notable for the following components:     Glucose-Capillary >600 (*)    All other components within normal limits  CBG MONITORING, ED - Abnormal; Notable for the following components:   Glucose-Capillary 542 (*)    All other components within normal limits  CBC    EKG None  Radiology No results found.  Procedures Procedures (including critical care time)  Medications Ordered in ED Medications  pioglitazone (ACTOS) tablet 30 mg (has no administration in time range)  sodium chloride 0.9 % bolus 1,000 mL (0 mLs Intravenous Stopped 11/04/18 0813)  sodium chloride 0.9 % bolus 1,000 mL (1,000 mLs Intravenous New Bag/Given 11/04/18 0910)  insulin aspart (novoLOG) injection 12 Units (12 Units Intravenous Given 11/04/18 0909)     Initial Impression / Assessment and Plan / ED Course  I have reviewed the triage vital signs and the nursing notes.  Pertinent labs & imaging results that were available during my care of the patient were reviewed by me and considered in my medical decision making (see chart for details).        Vital signs are stable.  Hydrated here in the emergency department.  Abdominal exam is benign.  No focal weakness.  Feeling better.  Blood sugar improving.  Patient would like to be discharged and to follow-up with the primary care physician.  She has been given contact information for [employer] [facility] [place] to contact them tomorrow for information regarding her insurance card and to obtain her insurance information so that she can receive her medications tomorrow.  She will pay out-of-pocket for Actos which I will write for.  She will continue with her metformin.  She will involve her primary care physician.  She understands return to the ER for new or worsening symptoms.  Final Clinical Impressions(s) / ED Diagnoses   Final diagnoses:  None    ED Discharge Orders    None       Azalia Bilis, MD 11/04/18 1100

## 2018-11-04 NOTE — Discharge Instructions (Addendum)
Call your insurance company in the morning for your insurance information

## 2018-11-04 NOTE — ED Triage Notes (Signed)
Patient complains of feeling like she was "blacking in and out" this morning while driving to work. History of diabetes, reports that she has been out of her diabetic medications for approximately two months, CBG machine at home read "High" this morning. Patient alert, oriented, and in no apparent distress at this time.

## 2018-12-01 ENCOUNTER — Ambulatory Visit (HOSPITAL_COMMUNITY)
Admission: EM | Admit: 2018-12-01 | Discharge: 2018-12-01 | Disposition: A | Discharge status: Home / Self Care | Payer: Worker's Compensation | Attending: Family Medicine | Admitting: Family Medicine

## 2018-12-01 ENCOUNTER — Ambulatory Visit (INDEPENDENT_AMBULATORY_CARE_PROVIDER_SITE_OTHER): Payer: Worker's Compensation

## 2018-12-01 ENCOUNTER — Encounter (HOSPITAL_COMMUNITY): Payer: Self-pay | Admitting: *Deleted

## 2018-12-01 ENCOUNTER — Other Ambulatory Visit: Payer: Self-pay

## 2018-12-01 DIAGNOSIS — M25531 Pain in right wrist: Secondary | ICD-10-CM

## 2018-12-01 DIAGNOSIS — S63501A Unspecified sprain of right wrist, initial encounter: Secondary | ICD-10-CM | POA: Diagnosis not present

## 2018-12-01 DIAGNOSIS — S8001XA Contusion of right knee, initial encounter: Secondary | ICD-10-CM

## 2018-12-01 DIAGNOSIS — M25561 Pain in right knee: Secondary | ICD-10-CM

## 2018-12-01 DIAGNOSIS — W19XXXA Unspecified fall, initial encounter: Secondary | ICD-10-CM

## 2018-12-01 MED ORDER — IBUPROFEN 800 MG PO TABS
800.0000 mg | ORAL_TABLET | Freq: Once | ORAL | Status: AC
  Administered 2018-12-01: 800 mg via ORAL

## 2018-12-01 MED ORDER — NAPROXEN 500 MG PO TABS: 500.0000 mg | ORAL_TABLET | Freq: Two times a day (BID) | ORAL | 0 refills | Status: DC

## 2018-12-01 MED ORDER — IBUPROFEN 800 MG PO TABS
ORAL_TABLET | ORAL | Status: AC
  Filled 2018-12-01: qty 1

## 2018-12-01 MED ORDER — CYCLOBENZAPRINE HCL 5 MG PO TABS: 5.0000 mg | ORAL_TABLET | Freq: Every day | ORAL | 0 refills | Status: DC

## 2018-12-01 NOTE — ED Provider Notes (Signed)
[facility]    CSN: [id number] Arrival date & time: 12/01/18  1018     History   Chief Complaint Chief Complaint  Patient presents with  . Fall    HPI Kiara Hobbs is a 53 y.o. female.   Kiara Hobbs presents with complaints of right knee and right wrist pain after a fall yesterday. Slipped on water while at work, falling onto her right knee and right outstretched hand. She feels like her wrist twisted. No numbness tingling or weakness to her right hand or foot since the injury. Pain immediately to knee and wrist. Last night and this morning with upper back and shoulder soreness. She is right handed. Pain 7/10. Didn't strike head or lose consciousness.Took tylenol yesterday which didn't help. Denies any previous wrist or knee injury. Hx of dm and htn.     ROS per HPI, negative if not otherwise mentioned.      Past Medical History:  Diagnosis Date  . Diabetes mellitus   . High cholesterol   . Hypertension   . Seasonal allergies     There are no active problems to display for this patient.   Past Surgical History:  Procedure Laterality Date  . ABDOMINAL HYSTERECTOMY    . TUBAL LIGATION      OB History   No obstetric history on file.      Home Medications    Prior to Admission medications   Medication Sig Start Date End Date Taking? Authorizing Provider  atorvastatin (LIPITOR) 40 MG tablet Take 40 mg by mouth daily. 09/12/18  Yes [provider]  dapagliflozin propanediol (FARXIGA) 10 MG TABS tablet Take 10 mg by mouth daily. 11/04/18  Yes Azalia Bilis, MD  Dulaglutide 1.5 MG/0.5ML SOPN Inject 1.5 mg into the skin once a week. 11/04/18  Yes Azalia Bilis, MD  fenofibrate 160 MG tablet Take 160 mg by mouth daily. 10/14/18  Yes [provider]  losartan (COZAAR) 25 MG tablet Take 25 mg by mouth daily. 09/18/18  Yes [provider]  metFORMIN (GLUCOPHAGE) 1000 MG tablet Take 1,000 mg by mouth 2 (two) times daily with a meal.   Yes  [provider]  metoprolol (LOPRESSOR) 50 MG tablet Take 50 mg by mouth 2 (two) times daily.   Yes [provider]  pioglitazone (ACTOS) 15 MG tablet Take 2 tablets (30 mg total) by mouth daily. 11/04/18  Yes Azalia Bilis, MD  cyclobenzaprine (FLEXERIL) 5 MG tablet Take 1 tablet (5 mg total) by mouth at bedtime. 12/01/18   Georgetta Haber, NP  naproxen (NAPROSYN) 500 MG tablet Take 1 tablet (500 mg total) by mouth 2 (two) times daily. 12/01/18   Georgetta Haber, NP    Family History Family History  Problem Relation Age of Onset  . Diabetes Mother   . Diabetes Father     Social History Social History   Tobacco Use  . Smoking status: Never Smoker  . Smokeless tobacco: Never Used  Substance Use Topics  . Alcohol use: No  . Drug use: No     Allergies   Penicillins and Lisinopril   Review of Systems Review of Systems   Physical Exam Triage Vital Signs ED Triage Vitals  Enc Vitals Group     BP 12/01/18 1032 110/80     Pulse Rate 12/01/18 1032 93     Resp --      Temp 12/01/18 1032 98 F (36.7 C)     Temp Source 12/01/18 1032 Oral  SpO2 12/01/18 1032 97 %     Weight --      Height --      Head Circumference --      Peak Flow --      Pain Score 12/01/18 1034 7     Pain Loc --      Pain Edu? --      Excl. in GC? --    No data found.  Updated Vital Signs BP 110/80   Pulse 93   Temp 98 F (36.7 C) (Oral)   SpO2 97%   Visual Acuity Right Eye Distance:   Left Eye Distance:   Bilateral Distance:    Right Eye Near:   Left Eye Near:    Bilateral Near:     Physical Exam Constitutional:      General: She is not in acute distress.    Appearance: She is well-developed.  Cardiovascular:     Rate and Rhythm: Normal rate and regular rhythm.     Heart sounds: Normal heart sounds.  Pulmonary:     Effort: Pulmonary effort is normal.     Breath sounds: Normal breath sounds.  Musculoskeletal:     Right shoulder: Normal.     Right elbow:  Normal.    Right wrist: She exhibits tenderness and bony tenderness. She exhibits normal range of motion, no swelling, no effusion, no crepitus, no deformity and no laceration.     Right knee: She exhibits bony tenderness. She exhibits normal range of motion, no swelling, no erythema, normal alignment, no LCL laxity, normal meniscus and no MCL laxity.     Comments: Right wrist pain at distal radius and with mild snuff box tenderness; full ROM to wrist, mild pain but no limitation; cap refill < 2 seconds; strength equal bilaterally; gross sensation intact to fingers; no swelling bruising or deformity; right knee with abrasion over patella with tenderness; no swelling or redness; no pain with ROM, no laxity  Skin:    General: Skin is warm and dry.  Neurological:     Mental Status: She is alert and oriented to person, place, and time.      UC Treatments / Results  Labs (all labs ordered are listed, but only abnormal results are displayed) Labs Reviewed - No data to display  EKG None  Radiology Dg Wrist Complete Right  Result Date: 12/01/2018 CLINICAL DATA:  Fall at work, RIGHT wrist injury. EXAM: RIGHT WRIST - COMPLETE 3+ VIEW COMPARISON:  None. FINDINGS: There is no evidence of fracture or dislocation. There is no evidence of arthropathy or other focal bone abnormality. Extensive vascular calcifications within the soft tissues. Soft tissues about the RIGHT wrist are otherwise unremarkable. IMPRESSION: Negative. Electronically Signed   By: Bary Richard M.D.   On: 12/01/2018 11:31   Dg Knee Complete 4 Views Right  Result Date: 12/01/2018 CLINICAL DATA:  Fall at work yesterday, RIGHT knee injury. EXAM: RIGHT KNEE - COMPLETE 4+ VIEW COMPARISON:  None. FINDINGS: Alignment is normal. Bone mineralization is normal. No fracture line or displaced fracture fragment seen. No acute or suspicious osseous lesion. No appreciable joint effusion. Incidental note made of chronic spurring along the anterior  margin of the patella. Vascular calcifications noted within the soft tissues adjacent to the distal femur. IMPRESSION: No acute findings. No osseous fracture or dislocation. No appreciable joint effusion. Electronically Signed   By: Bary Richard M.D.   On: 12/01/2018 11:32    Procedures Procedures (including critical care time)  Medications Ordered  in UC Medications  ibuprofen (ADVIL,MOTRIN) tablet 800 mg (800 mg Oral Given 12/01/18 1111)    Initial Impression / Assessment and Plan / UC Course  I have reviewed the triage vital signs and the nursing notes.  Pertinent labs & imaging results that were available during my care of the patient were reviewed by me and considered in my medical decision making (see chart for details).     Right wrist sprain and mild snuff box tenderness, thumb spica provided. Abrasion and tenderness surrounding to right knee. xrays normal. Ice, elevation, nsaids for pain. Follow up with PCP and/or ortho as needed. Patient verbalized understanding and agreeable to plan.  Ambulatory out of clinic without difficulty.    Final Clinical Impressions(s) / UC Diagnoses   Final diagnoses:  Sprain of right wrist, initial encounter  Contusion of right knee, initial encounter  Fall, initial encounter     Discharge Instructions     Ice and elevation of your knee and wrist to help with pain and swelling.  Use of wrist brace as needed for pain.  Naproxen twice a day, take with food. Don't take additional ibuprofen.  May use flexeril at night as a muscle relaxer. May cause drowsiness. Please do not take if driving or drinking alcohol.   Light and regular activity as tolerated will be helpful.  Follow up as needed for persistent symptoms with your PCP and/or ortho.    ED Prescriptions    Medication Sig Dispense Auth. Provider   naproxen (NAPROSYN) 500 MG tablet Take 1 tablet (500 mg total) by mouth 2 (two) times daily. 30 tablet Linus Mako B, NP   cyclobenzaprine  (FLEXERIL) 5 MG tablet Take 1 tablet (5 mg total) by mouth at bedtime. 15 tablet Georgetta Haber, NP     Controlled Substance Prescriptions [place] Controlled Substance Registry consulted? Not Applicable   Georgetta Haber, NP 12/01/18 1321

## 2018-12-01 NOTE — ED Triage Notes (Signed)
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   C/O right knee pain with small abrasion; c/o significant right wrist pain radiating up into RUE.  CMS intact.  Ambulates with limp.

## 2018-12-01 NOTE — Discharge Instructions (Addendum)
Ice and elevation of your knee and wrist to help with pain and swelling.  Use of wrist brace as needed for pain.  Naproxen twice a day, take with food. Don't take additional ibuprofen.  May use flexeril at night as a muscle relaxer. May cause drowsiness. Please do not take if driving or drinking alcohol.   Light and regular activity as tolerated will be helpful.  Follow up as needed for persistent symptoms with your PCP and/or ortho.

## 2019-07-22 ENCOUNTER — Ambulatory Visit: Payer: Self-pay | Admitting: Internal Medicine

## 2019-07-22 ENCOUNTER — Encounter: Payer: Self-pay | Admitting: Internal Medicine

## 2019-07-22 ENCOUNTER — Other Ambulatory Visit: Payer: Self-pay

## 2019-07-22 VITALS — BP 138/82 | HR 82 | Resp 12 | Ht 65.0 in | Wt 185.0 lb

## 2019-07-22 DIAGNOSIS — A6004 Herpesviral vulvovaginitis: Secondary | ICD-10-CM

## 2019-07-22 DIAGNOSIS — E782 Mixed hyperlipidemia: Secondary | ICD-10-CM

## 2019-07-22 DIAGNOSIS — E1165 Type 2 diabetes mellitus with hyperglycemia: Secondary | ICD-10-CM

## 2019-07-22 DIAGNOSIS — Z23 Encounter for immunization: Secondary | ICD-10-CM

## 2019-07-22 DIAGNOSIS — N761 Subacute and chronic vaginitis: Secondary | ICD-10-CM

## 2019-07-22 DIAGNOSIS — E010 Iodine-deficiency related diffuse (endemic) goiter: Secondary | ICD-10-CM

## 2019-07-22 DIAGNOSIS — I10 Essential (primary) hypertension: Secondary | ICD-10-CM

## 2019-07-22 LAB — POCT WET PREP WITH KOH
Clue Cells Wet Prep HPF POC: NEGATIVE
KOH Prep POC: NEGATIVE
RBC Wet Prep HPF POC: NEGATIVE
Trichomonas, UA: NEGATIVE

## 2019-07-22 LAB — GLUCOSE, POCT (MANUAL RESULT ENTRY): POC Glucose: 194 mg/dl — AB (ref 70–99)

## 2019-07-22 MED ORDER — LOSARTAN POTASSIUM 50 MG PO TABS: ORAL_TABLET | ORAL | 6 refills | Status: DC

## 2019-07-22 MED ORDER — ACYCLOVIR 400 MG PO TABS: ORAL_TABLET | ORAL | 11 refills | Status: DC

## 2019-07-22 MED ORDER — FLUCONAZOLE 150 MG PO TABS: ORAL_TABLET | ORAL | 0 refills | Status: DC

## 2019-07-22 MED ORDER — METOPROLOL TARTRATE 50 MG PO TABS: 50.0000 mg | ORAL_TABLET | Freq: Two times a day (BID) | ORAL | 11 refills | Status: DC

## 2019-07-22 NOTE — Progress Notes (Signed)
Subjective:    Patient ID: Kiara Hobbs, female   DOB: [birth year], 53 y.o.   MRN: [id number]   HPI   Here to establish  Switched job and no longer with insurance.  She has not been seen by her endocrinologist for 6 months--Dr. Posey Pronto at [facility]. Noted history of noncompliance from Care Everywhere documentation.  Poor historian-history is not clear regarding her diabetic control. Found old labs in [place] over past 3 years ranging from a low of 7.4 on 08/2017 to 13.0 with earlier checks.  She was back up to 13.0 on 11/28/2018, despite reportedly using her current medications.  1.  DM:  Diagnosed in 2003.  She was on Glipizide and related medications in the past without good control.  Was taking Metformin ER 500 mg twice daily as she had GI intolerance to the regular release.  She stopped taking Metformin ER as she was concerned about the cancer reports with [place].  She states her sugars were in the 300s until she was started on Farxiga and Dulaglutide (Trulicity).   She has both of these until the end of the month. Just ran out of Actos 15 mg about 1-2 months ago. Had been taking Actos for about 2 years as well.    Was using Novolog 70/30 15 units twice daily before many of these meds started.  Insulin completely discontinued about 1 year ago.    States her last A1C was around 9.0% 6 months ago with [facility] endocrine, but unable to find that in care everywhere labs.    Last eye check was 1 year ago.    2.  Hyperlipidemia:  Unable to find cholesterol panels in care everywhere.  Was taking Atorvastatin, but states when cholesterol panel came back normal,  she stopped the medicine.  3.  Hypertension:  Diagnosed 2014.  She does walk regularly.  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  Works at [employer] in [place] [employer].    4. Constant genital discharge and itching, burning.  Feels she has had a constant yeast infection, which she blames on the [place].  She did not have yeast infections with  poor glucose control before.  Seems more of a problem since [place]. She was diagnosed with genital herpes in June, which was likely actually a recurrence based on her history.  Current Meds  Medication Sig  . Azelastine HCl 0.15 % SOLN Place into the nose. 2 sprays in each nostril daily  . cyclobenzaprine (FLEXERIL) 5 MG tablet Take 1 tablet (5 mg total) by mouth at bedtime.  . dapagliflozin propanediol (FARXIGA) 10 MG TABS tablet Take 10 mg by mouth daily.  . Dulaglutide 1.5 MG/0.5ML SOPN Inject 1.5 mg into the skin once a week.  . fluticasone (FLONASE) 50 MCG/ACT nasal spray USE 2 SPRAYS IN EACH NOSTRIL DAILY  . ipratropium (ATROVENT) 0.06 % nasal spray USE 2 SPRAYS NASALLY TWICE DAILY  . losartan (COZAAR) 25 MG tablet Take 25 mg by mouth daily.  . metoprolol (LOPRESSOR) 50 MG tablet Take 50 mg by mouth 2 (two) times daily.   Allergies  Allergen Reactions  . Penicillins Itching and Rash    Did it involve swelling of the face/tongue/throat, SOB, or low BP? No Did it involve sudden or severe rash/hives, skin peeling, or any reaction on the inside of your mouth or nose? No Did you need to seek medical attention at a hospital or doctor's office? No When did it last happen?unk If all above answers are "NO", may  proceed with cephalosporin use.   Marland Kitchen Lisinopril Cough   Past Medical History:  Diagnosis Date  . Diabetes mellitus   . High cholesterol   . Hypertension   . Seasonal allergies     Past Surgical History:  Procedure Laterality Date  . ABDOMINAL HYSTERECTOMY    . TUBAL LIGATION      Review of Systems    Objective:   BP 138/82 (BP Location: Left Arm, Patient Position: Sitting, Cuff Size: Normal)   Pulse 82   Resp 12   Ht 5\' 5"  (1.651 m)   Wt 185 lb (83.9 kg)   BMI 30.79 kg/m   Physical Exam  Appears chronically ill. HEENT:  PERRL, EOMI Neck:  Supple, No adenopathy, mild thyroid enlargement Chest:  CTA CV:  RRR with normal S1 and S2, No S3, S4 or murmur.   Radial and DP pulses normal and equal Abd:  S, NT, No HSM or mass, + BS LE:  No edema   Assessment & Plan   1.  DM:  Labs returned this morning (07/23/2019) with A1C at 12.3%.  Called patient--again, hard to get a good history with meds in past.   She will stop 13/06/2019 as she feels that is adding to her problems with chronic/recurrent vaginal yeast infection. Start Metformin ER 500 mg 2 tabs twice daily and Glipizide 10 mg twice daily with meals. Will see if this controls blood sugars. She will run out of Trulicity end of this month.  Suspect she has not been taking as prescribed regardless. She has a One Touch Verio monitor--Rx for strips and lancets sent in as well today.  Kidney function appears fine currently.  2.  Vaginal Yeast:  Fluconazole 150 mg daily for 3 days.  To stop [place], which can increase risk, but more importantly get her sugars under control.    3.  Genital Herpes:  Did find her + testing for HSV 1 and 2 Immunoglobulins in her [facility] bloodwork from 02/2019.   Acyclovir 400 mg twice daily as suppressive treatment.  4.  Hypertension:  Fair control.  Apparently taking Metoprolol and Losartan.    5.  Thyromegaly:  TSH this morning (07/23/2019) normal.  6.  HM:  influenza

## 2019-07-23 LAB — COMPREHENSIVE METABOLIC PANEL
ALT: 20 IU/L (ref 0–32)
AST: 28 IU/L (ref 0–40)
Albumin/Globulin Ratio: 1.3 (ref 1.2–2.2)
Albumin: 4.4 g/dL (ref 3.8–4.9)
Alkaline Phosphatase: 221 IU/L — ABNORMAL HIGH (ref 39–117)
BUN/Creatinine Ratio: 15 (ref 9–23)
BUN: 11 mg/dL (ref 6–24)
Bilirubin Total: 0.3 mg/dL (ref 0.0–1.2)
CO2: 25 mmol/L (ref 20–29)
Calcium: 10.1 mg/dL (ref 8.7–10.2)
Chloride: 101 mmol/L (ref 96–106)
Creatinine, Ser: 0.72 mg/dL (ref 0.57–1.00)
GFR calc Af Amer: 111 mL/min/{1.73_m2} (ref >59)
GFR calc non Af Amer: 97 mL/min/{1.73_m2} (ref >59)
Globulin, Total: 3.5 g/dL (ref 1.5–4.5)
Glucose: 143 mg/dL — ABNORMAL HIGH (ref 65–99)
Potassium: 4.4 mmol/L (ref 3.5–5.2)
Sodium: 139 mmol/L (ref 134–144)
Total Protein: 7.9 g/dL (ref 6.0–8.5)

## 2019-07-23 LAB — TSH: TSH: 0.855 u[IU]/mL (ref 0.450–4.500)

## 2019-07-23 LAB — HGB A1C W/O EAG: Hgb A1c MFr Bld: 12.3 % — ABNORMAL HIGH (ref 4.8–5.6)

## 2019-07-23 MED ORDER — GLIPIZIDE 10 MG PO TABS: ORAL_TABLET | ORAL | 11 refills | Status: DC

## 2019-07-23 MED ORDER — ONETOUCH ULTRASOFT LANCETS MISC: 11 refills | Status: AC

## 2019-07-23 MED ORDER — ONETOUCH VERIO VI STRP: ORAL_STRIP | 11 refills | Status: AC

## 2019-07-23 MED ORDER — METFORMIN HCL ER 500 MG PO TB24: ORAL_TABLET | ORAL | 11 refills | Status: AC

## 2019-07-24 ENCOUNTER — Telehealth: Payer: Self-pay | Admitting: Licensed Clinical Social Worker

## 2019-07-24 NOTE — Telephone Encounter (Signed)
Patient called to let Dr. Amil Amen know the strips she prescribed her are 80.00 dollars and she cannot afford them.  Patient stated she is unable to check her sugar without them.  SWK let the patient know she will relay the message to the [place].

## 2019-08-05 ENCOUNTER — Other Ambulatory Visit: Payer: Self-pay

## 2019-08-12 ENCOUNTER — Other Ambulatory Visit: Payer: Self-pay

## 2019-08-27 ENCOUNTER — Ambulatory Visit: Payer: Self-pay | Admitting: Internal Medicine

## 2019-09-24 ENCOUNTER — Ambulatory Visit: Payer: Self-pay | Admitting: Internal Medicine

## 2019-10-13 ENCOUNTER — Encounter: Payer: Self-pay | Admitting: Internal Medicine

## 2019-11-05 ENCOUNTER — Encounter (HOSPITAL_BASED_OUTPATIENT_CLINIC_OR_DEPARTMENT_OTHER): Payer: Self-pay | Admitting: *Deleted

## 2019-11-05 ENCOUNTER — Other Ambulatory Visit: Payer: Self-pay

## 2019-11-05 DIAGNOSIS — M62838 Other muscle spasm: Secondary | ICD-10-CM | POA: Insufficient documentation

## 2019-11-05 DIAGNOSIS — Y999 Unspecified external cause status: Secondary | ICD-10-CM | POA: Insufficient documentation

## 2019-11-05 DIAGNOSIS — I10 Essential (primary) hypertension: Secondary | ICD-10-CM | POA: Insufficient documentation

## 2019-11-05 DIAGNOSIS — Y929 Unspecified place or not applicable: Secondary | ICD-10-CM | POA: Insufficient documentation

## 2019-11-05 DIAGNOSIS — S76312A Strain of muscle, fascia and tendon of the posterior muscle group at thigh level, left thigh, initial encounter: Secondary | ICD-10-CM | POA: Insufficient documentation

## 2019-11-05 DIAGNOSIS — Z79899 Other long term (current) drug therapy: Secondary | ICD-10-CM | POA: Insufficient documentation

## 2019-11-05 DIAGNOSIS — Y939 Activity, unspecified: Secondary | ICD-10-CM | POA: Insufficient documentation

## 2019-11-05 DIAGNOSIS — E119 Type 2 diabetes mellitus without complications: Secondary | ICD-10-CM | POA: Insufficient documentation

## 2019-11-05 DIAGNOSIS — Z7984 Long term (current) use of oral hypoglycemic drugs: Secondary | ICD-10-CM | POA: Insufficient documentation

## 2019-11-05 DIAGNOSIS — X58XXXA Exposure to other specified factors, initial encounter: Secondary | ICD-10-CM | POA: Insufficient documentation

## 2019-11-05 NOTE — ED Triage Notes (Signed)
Pain in thighs and buttocks for on and off x 3 months. Pain in her left shoulder and neck. Pain is relieved by massage.

## 2019-11-06 ENCOUNTER — Emergency Department (HOSPITAL_BASED_OUTPATIENT_CLINIC_OR_DEPARTMENT_OTHER)
Admission: EM | Admit: 2019-11-06 | Discharge: 2019-11-06 | Disposition: A | Discharge status: Home / Self Care | Payer: Self-pay | Attending: Emergency Medicine | Admitting: Emergency Medicine

## 2019-11-06 DIAGNOSIS — S76312A Strain of muscle, fascia and tendon of the posterior muscle group at thigh level, left thigh, initial encounter: Secondary | ICD-10-CM

## 2019-11-06 DIAGNOSIS — M62838 Other muscle spasm: Secondary | ICD-10-CM

## 2019-11-06 MED ORDER — METHOCARBAMOL 500 MG PO TABS: 500.0000 mg | ORAL_TABLET | Freq: Three times a day (TID) | ORAL | 0 refills | Status: DC | PRN

## 2019-11-06 MED ORDER — MELOXICAM 15 MG PO TABS: 15.0000 mg | ORAL_TABLET | Freq: Every day | ORAL | 0 refills | Status: DC

## 2019-11-06 MED ORDER — KETOROLAC TROMETHAMINE 30 MG/ML IJ SOLN
60.0000 mg | Freq: Once | INTRAMUSCULAR | Status: AC
  Administered 2019-11-06: 60 mg via INTRAMUSCULAR
  Filled 2019-11-06: qty 2

## 2019-11-06 NOTE — ED Provider Notes (Signed)
TIME SEEN: 1:10 AM  CHIEF COMPLAINT: Left neck pain, left buttock pain  HPI: Patient is a 54 year old female history of hypertension, diabetes and hyperlipidemia who presents to the emergency department with over 3 months of pain in the left trapezius and left hamstring.  Symptoms better with massage.  Worse with movement.  No known injury.  No numbness, tingling, weakness, bowel or bladder incontinence.  No medications tried prior to arrival.  No fever.  ROS: See HPI Constitutional: no fever  Eyes: no drainage  ENT: no runny nose   Cardiovascular:  no chest pain  Resp: no SOB  GI: no vomiting GU: no dysuria Integumentary: no rash  Allergy: no hives  Musculoskeletal: no leg swelling  Neurological: no slurred speech ROS otherwise negative  PAST MEDICAL HISTORY/PAST SURGICAL HISTORY:  Past Medical History:  Diagnosis Date  . Diabetes mellitus   . High cholesterol   . Hypertension   . Seasonal allergies     MEDICATIONS:  Prior to Admission medications   Medication Sig Start Date End Date Taking? Authorizing Provider  acyclovir (ZOVIRAX) 400 MG tablet 1 tab by mouth twice daily. 07/22/19  Yes Julieanne Manson, MD  atorvastatin (LIPITOR) 40 MG tablet Take 40 mg by mouth daily. 09/12/18  Yes [provider]  Azelastine HCl 0.15 % SOLN Place into the nose. 2 sprays in each nostril daily 01/04/19  Yes [provider]  dapagliflozin propanediol (FARXIGA) 10 MG TABS tablet Take 10 mg by mouth daily. 11/04/18  Yes Azalia Bilis, MD  Dulaglutide 1.5 MG/0.5ML SOPN Inject 1.5 mg into the skin once a week. 11/04/18  Yes Azalia Bilis, MD  fenofibrate 160 MG tablet Take 160 mg by mouth daily. 10/14/18  Yes [provider]  fluticasone (FLONASE) 50 MCG/ACT nasal spray USE 2 SPRAYS IN EACH NOSTRIL DAILY 06/12/19  Yes [provider]  glipiZIDE (GLUCOTROL) 10 MG tablet 1 tab by mouth twice daily with meals 07/23/19  Yes Julieanne Manson, MD  glucose blood  (ONETOUCH VERIO) test strip Check blood glucose twice daily before meals 07/23/19  Yes Julieanne Manson, MD  ipratropium (ATROVENT) 0.06 % nasal spray USE 2 SPRAYS NASALLY TWICE DAILY 06/12/19  Yes [provider]  Lancets (ONETOUCH ULTRASOFT) lancets Check blood glucose twice daily before meals 07/23/19  Yes Julieanne Manson, MD  losartan (COZAAR) 50 MG tablet 1/2 tab by mouth daily 07/22/19  Yes Julieanne Manson, MD  metFORMIN (GLUCOPHAGE-XR) 500 MG 24 hr tablet 2 tabs by mouth twice daily with meals 07/23/19  Yes Julieanne Manson, MD  metoprolol tartrate (LOPRESSOR) 50 MG tablet Take 1 tablet (50 mg total) by mouth 2 (two) times daily. 07/22/19  Yes Julieanne Manson, MD  pioglitazone (ACTOS) 15 MG tablet Take 2 tablets (30 mg total) by mouth daily. 11/04/18  Yes Azalia Bilis, MD  cyclobenzaprine (FLEXERIL) 5 MG tablet Take 1 tablet (5 mg total) by mouth at bedtime. 12/01/18   Georgetta Haber, NP  fluconazole (DIFLUCAN) 150 MG tablet 1 tab by mouth daily for 3 days. 07/22/19   Julieanne Manson, MD  pantoprazole (PROTONIX) 40 MG tablet Take by mouth daily.  02/06/19   [provider]    ALLERGIES:  Allergies  Allergen Reactions  . Penicillins Itching and Rash    Did it involve swelling of the face/tongue/throat, SOB, or low BP? No Did it involve sudden or severe rash/hives, skin peeling, or any reaction on the inside of your mouth or nose? No Did you need to seek medical attention at  a hospital or doctor's office? No When did it last happen?unk If all above answers are "NO", may proceed with cephalosporin use.   Marland Kitchen Lisinopril Cough    SOCIAL HISTORY:  Social History   Tobacco Use  . Smoking status: Never Smoker  . Smokeless tobacco: Never Used  Substance Use Topics  . Alcohol use: No    FAMILY HISTORY: Family History  Problem Relation Age of Onset  . Diabetes Mother   . Diabetes Father     EXAM: BP (!) 171/93   Pulse 99   Temp 98.2 F  (36.8 C) (Oral)   Resp 18   Ht 5\' 4"  (1.626 m)   Wt 84.8 kg   SpO2 99%   BMI 32.10 kg/m  CONSTITUTIONAL: Alert and oriented and responds appropriately to questions. Well-appearing; well-nourished, afebrile, nontoxic, in no distress HEAD: Normocephalic EYES: Conjunctivae clear, pupils appear equal, EOM appear intact ENT: normal nose; moist mucous membranes NECK: Supple, normal ROM CARD: RRR; S1 and S2 appreciated; no murmurs, no clicks, no rubs, no gallops RESP: Normal chest excursion without splinting or tachypnea; breath sounds clear and equal bilaterally; no wheezes, no rhonchi, no rales, no hypoxia or respiratory distress, speaking full sentences ABD/GI: Normal bowel sounds; non-distended; soft, non-tender, no rebound, no guarding, no peritoneal signs, no hepatosplenomegaly BACK:  The back appears normal, patient has a very ropey, tender, tight left trapezius muscle.  There is no redness, warmth or swelling.  No lesions noted to this area.  She has a knot of tight musculature in the center of the trapezius muscle that is painful to palpation but improves with massage.  There is no midline spinal tenderness.  There is no step-off or deformity.  No ecchymosis, redness, warmth or soft tissue swelling noted. EXT: Normal ROM in all joints; no deformity noted, no edema; no cyanosis; patient is tender to palpation over the posterior left hamstring.  Some tenderness also in the left gluteus muscles.  No redness, warmth, soft tissue swelling, ecchymosis, crepitus noted.  Compartments in her extremities are soft.  Extremities are warm and well-perfused.  No joint effusions noted.  No calf tenderness or calf swelling on exam. SKIN: Normal color for age and race; warm; no rash on exposed skin NEURO: Moves all extremities equally reports normal sensation diffusely, normal speech, no saddle anesthesia, normal gait PSYCH: The patient's mood and manner are appropriate.   MEDICAL DECISION MAKING: Patient  here with muscle strain, spasms.  Recommended continued massage, alternating ice and heat.  She has no known history of renal dysfunction.  Last creatinine in November 2020 was 0.72.  Recommended anti-inflammatories.  Will discharge with Mobic.  Will give IM Toradol here.  Patient drove herself to the emergency department and therefore we will avoid sedative medications prior to her discharge.  Recommended muscle relaxers.  Recommended alternating ice and heat.  Recommended close PCP follow-up for continued management of this chronic pain.  No injury or bony tenderness on his exam to suggest fracture.  I do not feel she needs an x-ray today.  Discussed with patient that she may benefit from physical therapy as an outpatient as well but this could be set up by her primary care doctor.  She verbalized understanding.  At this time, I do not feel there is any life-threatening condition present. I have reviewed, interpreted and discussed all results (EKG, imaging, lab, urine as appropriate) and exam findings with patient/family. I have reviewed nursing notes and appropriate previous records.  I feel the  patient is safe to be discharged home without further emergent workup and can continue workup as an outpatient as needed. Discussed usual and customary return precautions. Patient/family verbalize understanding and are comfortable with this plan.  Outpatient follow-up has been provided as needed. All questions have been answered.    Kiara Hobbs was evaluated in Emergency Department on 11/06/2019 for the symptoms described in the history of present illness. She was evaluated in the context of the global COVID-19 pandemic, which necessitated consideration that the patient might be at risk for infection with the SARS-CoV-2 virus that causes COVID-19. Institutional protocols and algorithms that pertain to the evaluation of patients at risk for COVID-19 are in a state of rapid change based on information released by  regulatory bodies including the CDC and federal and state organizations. These policies and algorithms were followed during the patient's care in the ED.  Patient was seen wearing N95, face shield, gloves.    Danisa Kopec, Layla Maw, DO 11/06/19 [phone]

## 2019-11-06 NOTE — Discharge Instructions (Addendum)
You may alternate Tylenol 1000 mg every 6 hours as needed for pain.  Do not take aspirin, ibuprofen, Aleve while taking Mobic.  I recommend continued massages, alternating ice and heat to these areas, stretching regularly, following up with her primary care doctor for further management.   Steps to find a Primary Care Provider (PCP):  Call [phone] or [phone] to access "[facility] Find a Doctor Service."  2.  You may also go on the [facility] website at [email or web address]  3.  [facility] also frequently accepts new patients.  [facility]  [address] [place] [address] [phone]  4.  There are also multiple [facility], [facility] and [facility] practices throughout the [place] that are frequently accepting new patients. You may find a clinic that is close to your home and contact them.  [facility] [email or web address] [phone]  [facility] [email or web address]  [email or web address] [phone]  [facility] [email or web address] [phone]  5.  Local Health Departments also can provide primary care services.  [facility]  [address] [place] [address] [phone]  [facility] Department [address] [place] [address] [phone]  [facility] [address] [place] [address]  [place] [address] [phone]

## 2020-02-20 IMAGING — DX RIGHT KNEE - COMPLETE 4+ VIEW
4 series · 4 of 4 positions shown · non-contrast
Comparison: None.

CLINICAL DATA: Fall at work yesterday, RIGHT knee injury.

EXAM:
RIGHT KNEE - COMPLETE 4+ VIEW

[knee ap]
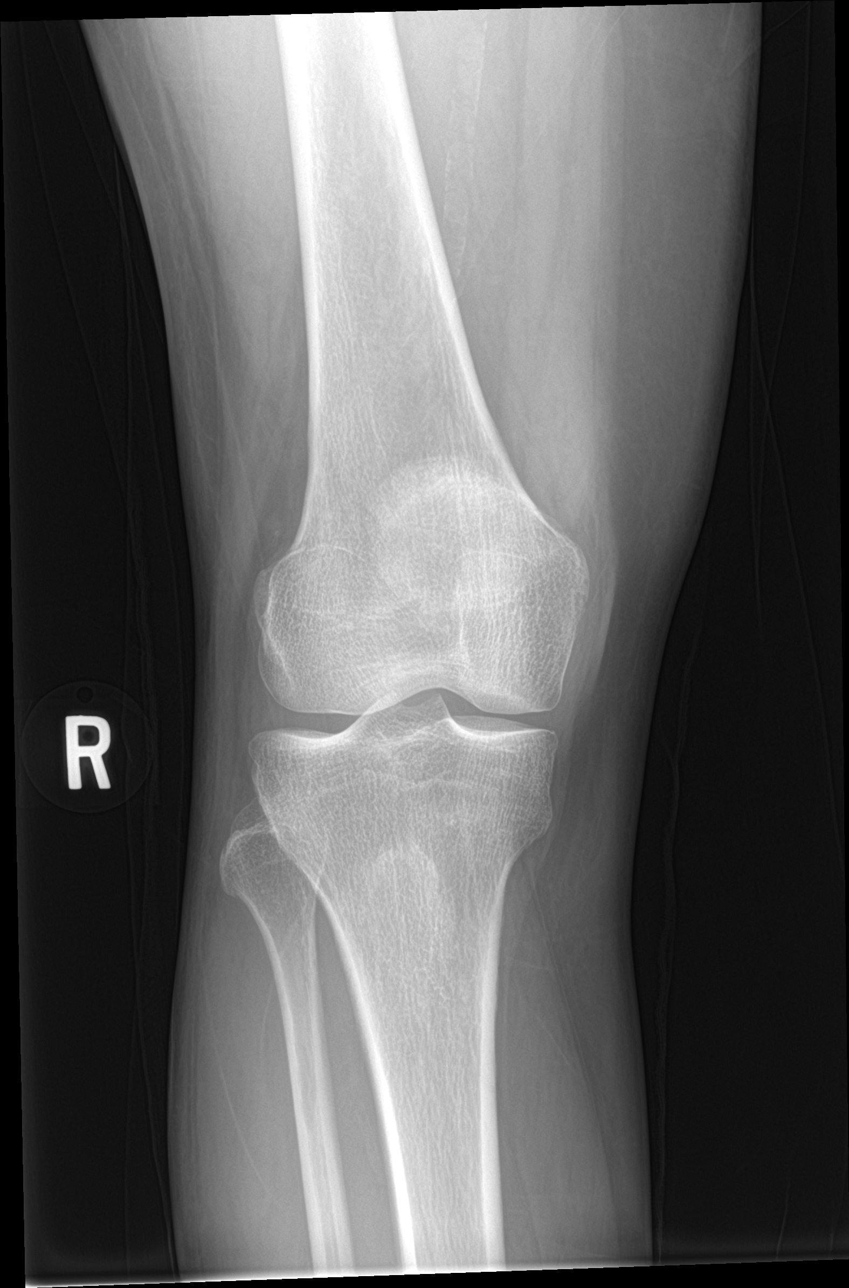

[knee obl (1 of 2)]
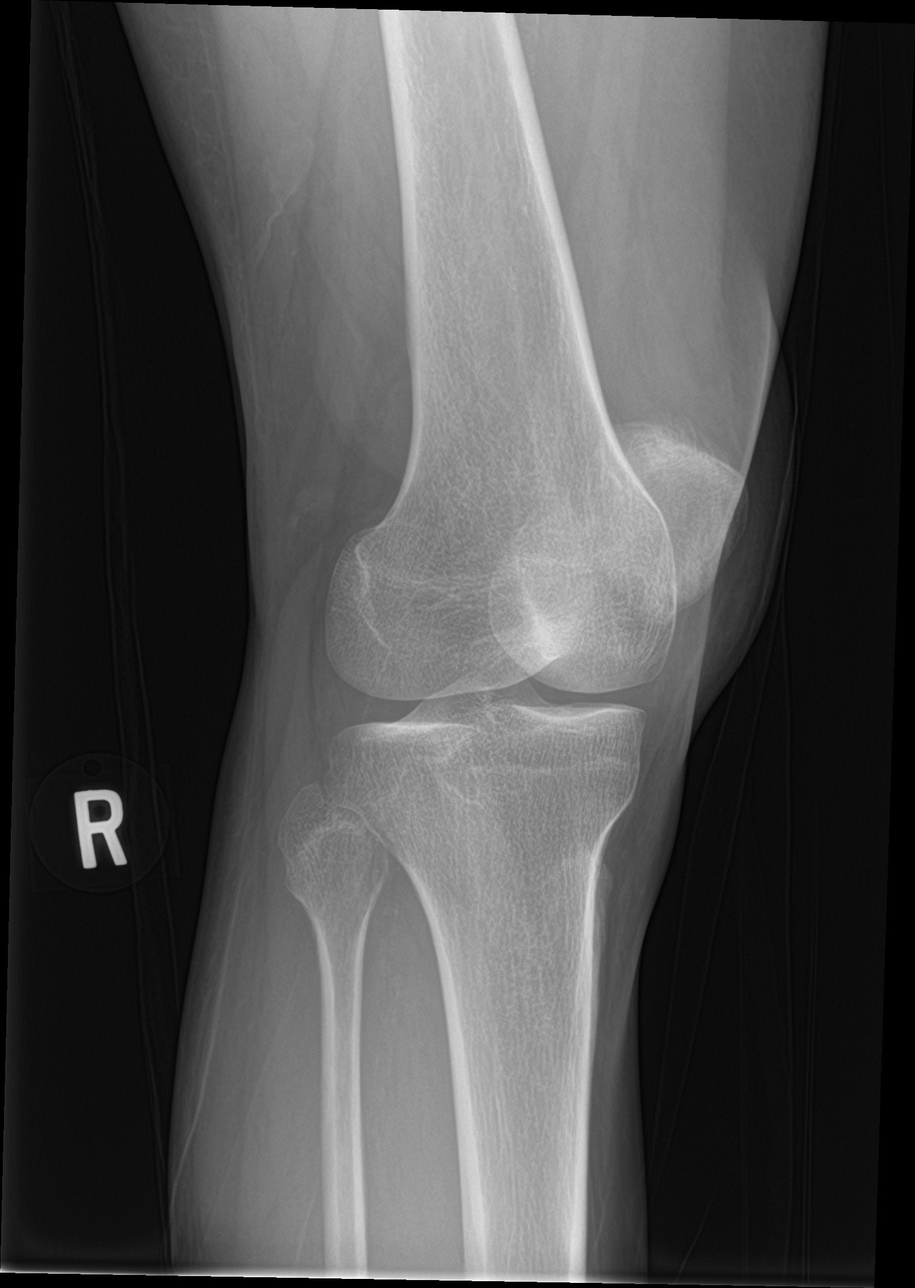

[knee obl (2 of 2)]
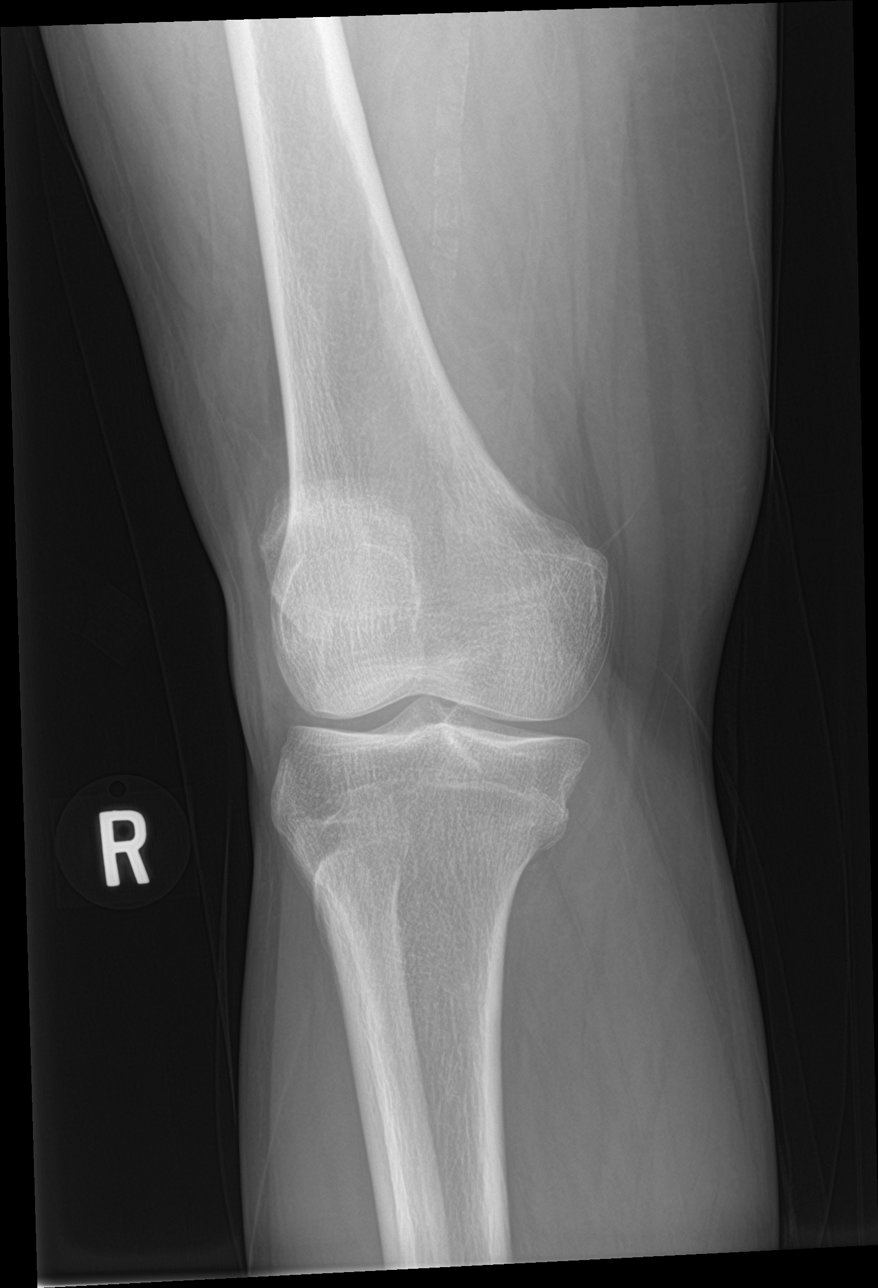

[knee lat]
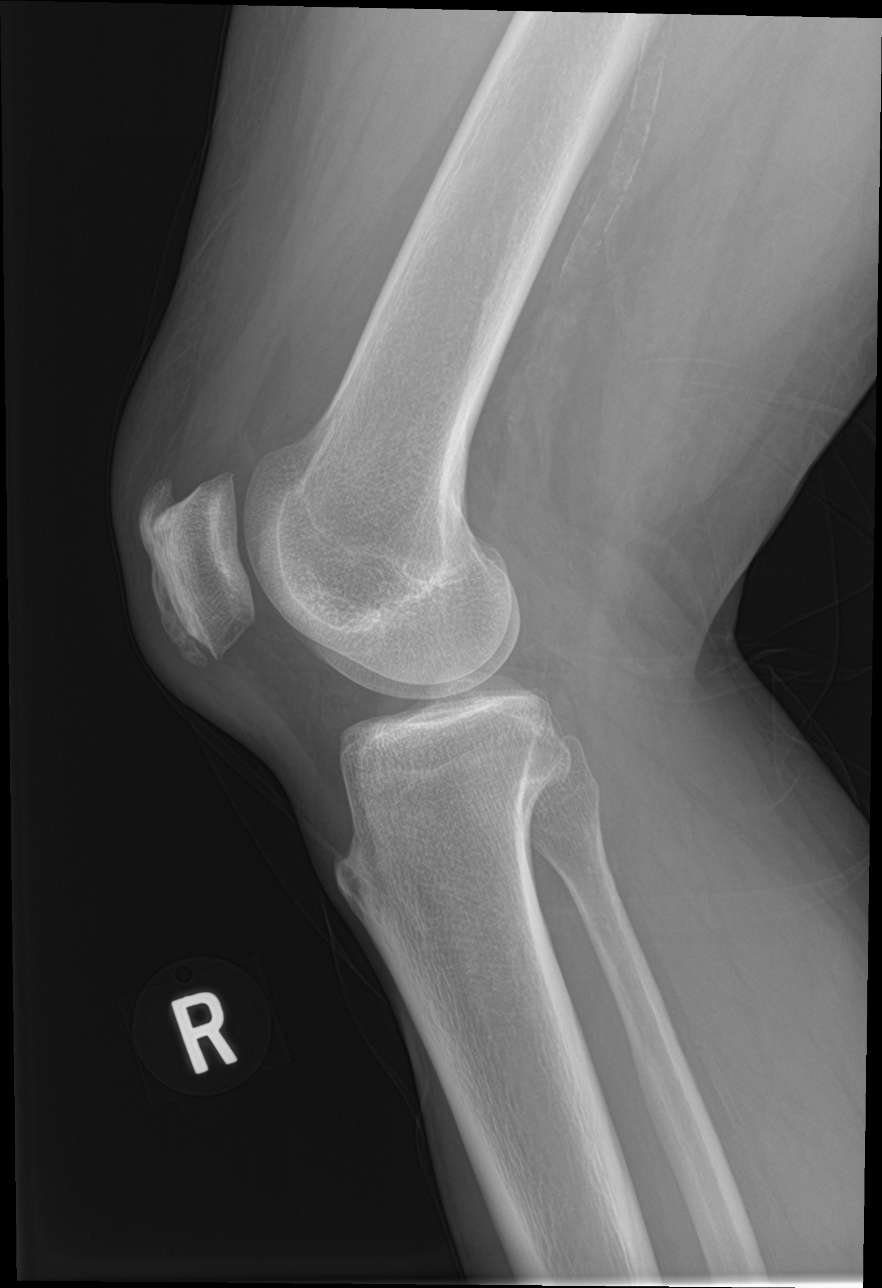

[4 of 4 positions shown; findings below may reference images not displayed]

FINDINGS: Alignment is normal. Bone mineralization is normal. No fracture line
or displaced fracture fragment seen. No acute or suspicious osseous
lesion. No appreciable joint effusion. Incidental note made of
chronic spurring along the anterior margin of the patella. Vascular
calcifications noted within the soft tissues adjacent to the distal
femur.
IMPRESSION: No acute findings. No osseous fracture or dislocation. No
appreciable joint effusion.

## 2020-09-13 ENCOUNTER — Other Ambulatory Visit: Payer: Self-pay | Admitting: Internal Medicine

## 2020-11-02 ENCOUNTER — Ambulatory Visit: Payer: Self-pay | Admitting: Allergy

## 2021-05-31 ENCOUNTER — Other Ambulatory Visit: Payer: Self-pay

## 2021-05-31 ENCOUNTER — Emergency Department (HOSPITAL_BASED_OUTPATIENT_CLINIC_OR_DEPARTMENT_OTHER): Payer: Self-pay

## 2021-05-31 ENCOUNTER — Encounter (HOSPITAL_BASED_OUTPATIENT_CLINIC_OR_DEPARTMENT_OTHER): Payer: Self-pay | Admitting: Emergency Medicine

## 2021-05-31 ENCOUNTER — Observation Stay (HOSPITAL_BASED_OUTPATIENT_CLINIC_OR_DEPARTMENT_OTHER)
Admission: EM | Admit: 2021-05-31 | Discharge: 2021-06-01 | Disposition: A | Discharge status: Home / Self Care | Payer: Self-pay | Attending: Internal Medicine | Admitting: Internal Medicine

## 2021-05-31 DIAGNOSIS — R079 Chest pain, unspecified: Secondary | ICD-10-CM | POA: Diagnosis present

## 2021-05-31 DIAGNOSIS — E1169 Type 2 diabetes mellitus with other specified complication: Secondary | ICD-10-CM | POA: Diagnosis present

## 2021-05-31 DIAGNOSIS — R112 Nausea with vomiting, unspecified: Secondary | ICD-10-CM | POA: Diagnosis present

## 2021-05-31 DIAGNOSIS — I152 Hypertension secondary to endocrine disorders: Secondary | ICD-10-CM | POA: Diagnosis present

## 2021-05-31 DIAGNOSIS — E785 Hyperlipidemia, unspecified: Secondary | ICD-10-CM | POA: Diagnosis present

## 2021-05-31 DIAGNOSIS — Z794 Long term (current) use of insulin: Secondary | ICD-10-CM | POA: Insufficient documentation

## 2021-05-31 DIAGNOSIS — Z79899 Other long term (current) drug therapy: Secondary | ICD-10-CM | POA: Insufficient documentation

## 2021-05-31 DIAGNOSIS — Z20822 Contact with and (suspected) exposure to covid-19: Secondary | ICD-10-CM | POA: Insufficient documentation

## 2021-05-31 DIAGNOSIS — Z7984 Long term (current) use of oral hypoglycemic drugs: Secondary | ICD-10-CM | POA: Insufficient documentation

## 2021-05-31 DIAGNOSIS — E119 Type 2 diabetes mellitus without complications: Secondary | ICD-10-CM | POA: Insufficient documentation

## 2021-05-31 DIAGNOSIS — E1159 Type 2 diabetes mellitus with other circulatory complications: Secondary | ICD-10-CM | POA: Diagnosis present

## 2021-05-31 DIAGNOSIS — R0789 Other chest pain: Secondary | ICD-10-CM

## 2021-05-31 DIAGNOSIS — I10 Essential (primary) hypertension: Secondary | ICD-10-CM | POA: Insufficient documentation

## 2021-05-31 DIAGNOSIS — R072 Precordial pain: Principal | ICD-10-CM | POA: Insufficient documentation

## 2021-05-31 LAB — COMPREHENSIVE METABOLIC PANEL
ALT: 21 U/L (ref 0–44)
AST: 55 U/L — ABNORMAL HIGH (ref 15–41)
Albumin: 3.9 g/dL (ref 3.5–5.0)
Alkaline Phosphatase: 73 U/L (ref 38–126)
Anion gap: 8 (ref 5–15)
BUN: 19 mg/dL (ref 6–20)
CO2: 25 mmol/L (ref 22–32)
Calcium: 9.6 mg/dL (ref 8.9–10.3)
Chloride: 103 mmol/L (ref 98–111)
Creatinine, Ser: 1.18 mg/dL — ABNORMAL HIGH (ref 0.44–1.00)
GFR, Estimated: 55 mL/min — ABNORMAL LOW (ref >60)
Glucose, Bld: 184 mg/dL — ABNORMAL HIGH (ref 70–99)
Potassium: 6.1 mmol/L — ABNORMAL HIGH (ref 3.5–5.1)
Sodium: 136 mmol/L (ref 135–145)
Total Bilirubin: 1.5 mg/dL — ABNORMAL HIGH (ref 0.3–1.2)
Total Protein: 7.6 g/dL (ref 6.5–8.1)

## 2021-05-31 LAB — CBC WITH DIFFERENTIAL/PLATELET
Abs Immature Granulocytes: 0.01 10*3/uL (ref 0.00–0.07)
Basophils Absolute: 0 10*3/uL (ref 0.0–0.1)
Basophils Relative: 1 %
Eosinophils Absolute: 0.2 10*3/uL (ref 0.0–0.5)
Eosinophils Relative: 2 %
HCT: 33.9 % — ABNORMAL LOW (ref 36.0–46.0)
Hemoglobin: 11.5 g/dL — ABNORMAL LOW (ref 12.0–15.0)
Immature Granulocytes: 0 %
Lymphocytes Relative: 37 %
Lymphs Abs: 2.7 10*3/uL (ref 0.7–4.0)
MCH: 29.4 pg (ref 26.0–34.0)
MCHC: 33.9 g/dL (ref 30.0–36.0)
MCV: 86.7 fL (ref 80.0–100.0)
Monocytes Absolute: 0.6 10*3/uL (ref 0.1–1.0)
Monocytes Relative: 8 %
Neutro Abs: 3.8 10*3/uL (ref 1.7–7.7)
Neutrophils Relative %: 52 %
Platelets: 288 10*3/uL (ref 150–400)
RBC: 3.91 MIL/uL (ref 3.87–5.11)
RDW: 14.6 % (ref 11.5–15.5)
WBC: 7.2 10*3/uL (ref 4.0–10.5)
nRBC: 0 % (ref 0.0–0.2)

## 2021-05-31 LAB — RESP PANEL BY RT-PCR (FLU A&B, COVID) ARPGX2
Influenza A by PCR: NEGATIVE
Influenza B by PCR: NEGATIVE
SARS Coronavirus 2 by RT PCR: NEGATIVE

## 2021-05-31 LAB — POTASSIUM: Potassium: 3.3 mmol/L — ABNORMAL LOW (ref 3.5–5.1)

## 2021-05-31 LAB — LIPASE, BLOOD: Lipase: 48 U/L (ref 11–51)

## 2021-05-31 LAB — MRSA NEXT GEN BY PCR, NASAL: MRSA by PCR Next Gen: NOT DETECTED

## 2021-05-31 LAB — TROPONIN I (HIGH SENSITIVITY)
Troponin I (High Sensitivity): <2 ng/L (ref <18)
Troponin I (High Sensitivity): 3 ng/L (ref <18)

## 2021-05-31 LAB — CBG MONITORING, ED: Glucose-Capillary: 128 mg/dL — ABNORMAL HIGH (ref 70–99)

## 2021-05-31 LAB — GLUCOSE, CAPILLARY: Glucose-Capillary: 179 mg/dL — ABNORMAL HIGH (ref 70–99)

## 2021-05-31 MED ORDER — POTASSIUM CHLORIDE IN NACL 40-0.9 MEQ/L-% IV SOLN
INTRAVENOUS | Status: AC
  Filled 2021-05-31 (×2): qty 1000

## 2021-05-31 MED ORDER — SODIUM CHLORIDE 0.9 % IV BOLUS
1000.0000 mL | Freq: Once | INTRAVENOUS | Status: AC
  Administered 2021-05-31: 1000 mL via INTRAVENOUS

## 2021-05-31 MED ORDER — PANTOPRAZOLE SODIUM 40 MG IV SOLR
40.0000 mg | Freq: Two times a day (BID) | INTRAVENOUS | Status: DC
  Administered 2021-05-31 – 2021-06-01 (×2): 40 mg via INTRAVENOUS
  Filled 2021-05-31 (×2): qty 40

## 2021-05-31 MED ORDER — MORPHINE SULFATE (PF) 4 MG/ML IV SOLN
4.0000 mg | Freq: Once | INTRAVENOUS | Status: AC
  Administered 2021-05-31: 4 mg via INTRAVENOUS
  Filled 2021-05-31: qty 1

## 2021-05-31 MED ORDER — INSULIN ASPART 100 UNIT/ML IJ SOLN
0.0000 [IU] | Freq: Three times a day (TID) | INTRAMUSCULAR | Status: DC
  Administered 2021-06-01: 1 [IU] via SUBCUTANEOUS

## 2021-05-31 MED ORDER — NITROGLYCERIN 0.4 MG SL SUBL
0.4000 mg | SUBLINGUAL_TABLET | SUBLINGUAL | Status: DC | PRN
  Administered 2021-05-31: 0.4 mg via SUBLINGUAL
  Filled 2021-05-31: qty 1

## 2021-05-31 MED ORDER — ZOLPIDEM TARTRATE 5 MG PO TABS
5.0000 mg | ORAL_TABLET | Freq: Once | ORAL | Status: AC
  Administered 2021-06-01: 5 mg via ORAL
  Filled 2021-05-31: qty 1

## 2021-05-31 MED ORDER — SODIUM CHLORIDE 0.9% FLUSH
3.0000 mL | Freq: Two times a day (BID) | INTRAVENOUS | Status: DC
  Administered 2021-05-31 – 2021-06-01 (×2): 3 mL via INTRAVENOUS

## 2021-05-31 MED ORDER — METOCLOPRAMIDE HCL 5 MG/ML IJ SOLN
10.0000 mg | Freq: Three times a day (TID) | INTRAMUSCULAR | Status: DC
  Administered 2021-05-31 – 2021-06-01 (×2): 10 mg via INTRAVENOUS
  Filled 2021-05-31 (×3): qty 2

## 2021-05-31 MED ORDER — ACETAMINOPHEN 325 MG PO TABS
650.0000 mg | ORAL_TABLET | Freq: Once | ORAL | Status: AC
  Administered 2021-05-31: 650 mg via ORAL
  Filled 2021-05-31: qty 2

## 2021-05-31 MED ORDER — ONDANSETRON HCL 4 MG/2ML IJ SOLN: 4.0000 mg | Freq: Four times a day (QID) | INTRAMUSCULAR | Status: DC | PRN

## 2021-05-31 MED ORDER — ACETAMINOPHEN 325 MG PO TABS
650.0000 mg | ORAL_TABLET | Freq: Four times a day (QID) | ORAL | Status: DC | PRN
  Filled 2021-05-31: qty 2

## 2021-05-31 MED ORDER — MORPHINE SULFATE (PF) 4 MG/ML IV SOLN
4.0000 mg | INTRAVENOUS | Status: DC | PRN
  Administered 2021-05-31: 4 mg via INTRAVENOUS
  Filled 2021-05-31: qty 1

## 2021-05-31 MED ORDER — ACETAMINOPHEN 650 MG RE SUPP: 650.0000 mg | Freq: Four times a day (QID) | RECTAL | Status: DC | PRN

## 2021-05-31 MED ORDER — ATORVASTATIN CALCIUM 40 MG PO TABS
40.0000 mg | ORAL_TABLET | Freq: Every day | ORAL | Status: DC
  Administered 2021-06-01: 40 mg via ORAL
  Filled 2021-05-31: qty 1

## 2021-05-31 MED ORDER — IOHEXOL 350 MG/ML SOLN
100.0000 mL | Freq: Once | INTRAVENOUS | Status: AC | PRN
  Administered 2021-05-31: 100 mL via INTRAVENOUS

## 2021-05-31 MED ORDER — ONDANSETRON HCL 4 MG PO TABS: 4.0000 mg | ORAL_TABLET | Freq: Four times a day (QID) | ORAL | Status: DC | PRN

## 2021-05-31 MED ORDER — ONDANSETRON HCL 4 MG/2ML IJ SOLN
4.0000 mg | Freq: Once | INTRAMUSCULAR | Status: AC
  Administered 2021-05-31: 4 mg via INTRAVENOUS
  Filled 2021-05-31: qty 2

## 2021-05-31 MED ORDER — ENOXAPARIN SODIUM 40 MG/0.4ML IJ SOSY
40.0000 mg | PREFILLED_SYRINGE | INTRAMUSCULAR | Status: DC
  Administered 2021-05-31: 40 mg via SUBCUTANEOUS
  Filled 2021-05-31: qty 0.4

## 2021-05-31 MED ORDER — ALUM & MAG HYDROXIDE-SIMETH 200-200-20 MG/5ML PO SUSP
30.0000 mL | Freq: Once | ORAL | Status: AC
  Administered 2021-05-31: 30 mL via ORAL
  Filled 2021-05-31: qty 30

## 2021-05-31 MED ORDER — LIDOCAINE VISCOUS HCL 2 % MT SOLN
15.0000 mL | Freq: Once | OROMUCOSAL | Status: AC
  Administered 2021-05-31: 15 mL via ORAL
  Filled 2021-05-31: qty 15

## 2021-05-31 MED ORDER — SENNOSIDES-DOCUSATE SODIUM 8.6-50 MG PO TABS: 1.0000 | ORAL_TABLET | Freq: Every evening | ORAL | Status: DC | PRN

## 2021-05-31 NOTE — ED Notes (Signed)
O2 95-100 , heart rate 90-100 when ambulating. Pt complaining of chest pain and sob while ambulating.

## 2021-05-31 NOTE — ED Triage Notes (Signed)
Reports stabbing pain in center of the chest with burning in the stomach on and off for the last week.  Also endorses SOB, n/v.  Vomited X 2 at the beginning of the week.  Denies any diarrhea.

## 2021-05-31 NOTE — ED Provider Notes (Signed)
[facility] EMERGENCY DEPARTMENT Provider Note   CSN: [id number] Arrival date & time: 05/31/21  0121     History Chief Complaint  Patient presents with   Chest Pain    Kiara Hobbs is a 55 y.o. female.  Patient is a 55 year old female with past medical history of diabetes, hypertension, hyperlipidemia.  Patient presenting today with a several day history of vomiting, nausea, and feeling generally unwell.  This evening she began to have tightness in her chest and feeling short of breath.  Her nausea continues and reports little p.o. intake over the past several days.  She tells me she had her "esophagus stretched" several years ago and this feels somewhat similar to the symptoms she experienced then.  The history is provided by the patient.  Chest Pain Pain location:  Substernal area Pain quality: tightness   Pain radiates to:  Upper back Pain severity:  Moderate Onset quality:  Sudden Duration:  3 days Timing:  Constant Progression:  Worsening Chronicity:  New     Past Medical History:  Diagnosis Date   Diabetes mellitus    High cholesterol    Hypertension    Seasonal allergies     There are no problems to display for this patient.   Past Surgical History:  Procedure Laterality Date   ABDOMINAL HYSTERECTOMY     TUBAL LIGATION       OB History   No obstetric history on file.     Family History  Problem Relation Age of Onset   Diabetes Mother    Diabetes Father     Social History   Tobacco Use   Smoking status: Never   Smokeless tobacco: Never  Vaping Use   Vaping Use: Never used  Substance Use Topics   Alcohol use: No   Drug use: No    Home Medications Prior to Admission medications   Medication Sig Start Date End Date Taking? Authorizing Provider  acyclovir (ZOVIRAX) 400 MG tablet 1 tab by mouth twice daily. 07/22/19   Julieanne Manson, MD  atorvastatin (LIPITOR) 40 MG tablet Take 40 mg by mouth daily. 09/12/18   [provider]  Azelastine HCl 0.15 % SOLN Place into the nose. 2 sprays in each nostril daily 01/04/19   [provider]  cyclobenzaprine (FLEXERIL) 5 MG tablet Take 1 tablet (5 mg total) by mouth at bedtime. 12/01/18   Georgetta Haber, NP  dapagliflozin propanediol (FARXIGA) 10 MG TABS tablet Take 10 mg by mouth daily. 11/04/18   Azalia Bilis, MD  Dulaglutide 1.5 MG/0.5ML SOPN Inject 1.5 mg into the skin once a week. 11/04/18   Azalia Bilis, MD  fenofibrate 160 MG tablet Take 160 mg by mouth daily. 10/14/18   [provider]  fluconazole (DIFLUCAN) 150 MG tablet 1 tab by mouth daily for 3 days. 07/22/19   Julieanne Manson, MD  fluticasone (FLONASE) 50 MCG/ACT nasal spray USE 2 SPRAYS IN [facility] NOSTRIL DAILY 06/12/19   [provider]  glipiZIDE (GLUCOTROL) 10 MG tablet 1 tab by mouth twice daily with meals 07/23/19   Julieanne Manson, MD  glucose blood (ONETOUCH VERIO) test strip Check blood glucose twice daily before meals 07/23/19   Julieanne Manson, MD  ipratropium (ATROVENT) 0.06 % nasal spray USE 2 SPRAYS NASALLY TWICE DAILY 06/12/19   [provider]  Lancets (ONETOUCH ULTRASOFT) lancets Check blood glucose twice daily before meals 07/23/19   Julieanne Manson, MD  losartan (COZAAR) 50 MG tablet 1/2 tab by mouth daily  07/22/19   Julieanne Manson, MD  meloxicam (MOBIC) 15 MG tablet Take 1 tablet (15 mg total) by mouth daily. 11/06/19   Ward, Layla Maw, DO  metFORMIN (GLUCOPHAGE-XR) 500 MG 24 hr tablet 2 tabs by mouth twice daily with meals 07/23/19   Julieanne Manson, MD  methocarbamol (ROBAXIN) 500 MG tablet Take 1 tablet (500 mg total) by mouth every 8 (eight) hours as needed for muscle spasms. 11/06/19   Ward, Layla Maw, DO  metoprolol tartrate (LOPRESSOR) 50 MG tablet Take 1 tablet (50 mg total) by mouth 2 (two) times daily. 07/22/19   Julieanne Manson, MD  pantoprazole (PROTONIX) 40 MG tablet Take by mouth daily.  02/06/19   [provider]  pioglitazone (ACTOS) 15 MG tablet Take 2 tablets (30 mg total) by mouth daily. 11/04/18   Azalia Bilis, MD    Allergies    Penicillins and Lisinopril  Review of Systems   Review of Systems  Cardiovascular:  Positive for chest pain.  All other systems reviewed and are negative.  Physical Exam Updated Vital Signs BP (!) 152/104 (BP Location: Left Arm)   Pulse 97   Temp 97.7 F (36.5 C) (Oral)   Resp (!) 42   Ht 5\' 4"  (1.626 m)   Wt 86.2 kg   SpO2 100%   BMI 32.61 kg/m   Physical Exam Vitals and nursing note reviewed.  Constitutional:      General: She is not in acute distress.    Appearance: She is well-developed. She is not diaphoretic.  HENT:     Head: Normocephalic and atraumatic.  Cardiovascular:     Rate and Rhythm: Normal rate and regular rhythm.     Heart sounds: No murmur heard.   No friction rub. No gallop.  Pulmonary:     Effort: Pulmonary effort is normal. No respiratory distress.     Breath sounds: Normal breath sounds. No wheezing.  Abdominal:     General: Bowel sounds are normal. There is no distension.     Palpations: Abdomen is soft.     Tenderness: There is no abdominal tenderness.  Musculoskeletal:        General: Normal range of motion.     Cervical back: Normal range of motion and neck supple.     Right lower leg: No tenderness. No edema.     Left lower leg: No tenderness. No edema.  Skin:    General: Skin is warm and dry.  Neurological:     General: No focal deficit present.     Mental Status: She is alert and oriented to person, place, and time.    ED Results / Procedures / Treatments   Labs (all labs ordered are listed, but only abnormal results are displayed) Labs Reviewed  COMPREHENSIVE METABOLIC PANEL  CBC WITH DIFFERENTIAL/PLATELET  TROPONIN I (HIGH SENSITIVITY)    EKG EKG Interpretation  Date/Time:  Monday May 31 2021 01:32:16 EDT Ventricular Rate:  96 PR Interval:  133 QRS Duration: 80 QT  Interval:  356 QTC Calculation: 450 R Axis:   34 Text Interpretation: Sinus rhythm Borderline T abnormalities, anterior leads No significant change since 01/11/2017 Confirmed by 03/13/2017 (Geoffery Lyons) on 05/31/2021 3:37:54 AM  Radiology No results found.  Procedures Procedures   Medications Ordered in ED Medications  sodium chloride 0.9 % bolus 1,000 mL (has no administration in time range)  ondansetron (ZOFRAN) injection 4 mg (has no administration in time range)  morphine 4 MG/ML injection 4 mg (has no administration in  time range)    ED Course  I have reviewed the triage vital signs and the nursing notes.  Pertinent labs & imaging results that were available during my care of the patient were reviewed by me and considered in my medical decision making (see chart for details).    MDM Rules/Calculators/A&P  Patient with history of diabetes, hypertension, hyperlipidemia presenting with weakness, nausea, vomiting for several days.  She developed chest discomfort and difficulty breathing this evening and feeling as if something was "just not right".  She arrives here with stable vital signs, but is tachypneic with no hypoxia.  Work-up initiated including EKG, laboratory studies, and chest x-ray.  These have all returned essentially unremarkable with COVID being negative.  Due to her ongoing dyspnea and tachypnea with ambulation, patient was sent for a CTA of the chest which revealed no evidence for pulmonary embolism or other acute pathology.  As I have not found a clear diagnosis and patient continues with chest discomfort and feeling short of breath, I feel as though admission is indicated.  She does have multiple risk factors.  I have spoken with Dr. Toniann Fail who agrees to admit.  Final Clinical Impression(s) / ED Diagnoses Final diagnoses:  None    Rx / DC Orders ED Discharge Orders     None        Geoffery Lyons, MD 05/31/21 [phone]

## 2021-05-31 NOTE — H&P (Signed)
History and Physical    Kiara Hobbs YME:[id number] DOB: 19-May-1966 DOA: 05/31/2021  PCP: Mack Hook, MD  Patient coming from: [place] [facility]  I have personally briefly reviewed patient's old medical records in [place]  Chief Complaint: Nausea, vomiting, chest pain  HPI: Kiara Hobbs is a 55 y.o. female with medical history significant for type 2 diabetes, hypertension, hyperlipidemia, GERD, lumbar radiculopathy who presented to the ED for evaluation of chest pain, nausea, and vomiting.  Patient states she has been on Trulicity for management of her type 2 diabetes in addition to metformin and Actos.  She says Trulicity was recently increased from 3 mg to 4.5 mg weekly.  Since then over the last week she has been having frequent nausea and vomiting.  She says she has been having chest pain which she describes as retrosternal without radiation.  These chest pains she says feel like bad heartburn/reflux.  She has been having associated nonproductive cough, worse when she changes positions.  She has also been feeling short of breath when walking short amount.    She has some sensation of feeling like there is something stuck in her upper chest area but is not having trouble swallowing food, liquids, or pills.  She does state that she required esophageal dilation sometime in the last few years due to issues swallowing her pills but has not recurrence of the symptoms.  She has noticed feeling full after only eating small amounts of food.  She does report some associated diaphoresis.  She denies any dysuria, diarrhea, constipation.  She denies any NSAID use.  She reports a current headache which started after receiving sublingual nitroglycerin in the ED.  ED Course:  Initial vitals showed BP 152/104, pulse 91, RR 14, temp 97.7 F, SPO2 100% on room air.    Initial labs show potassium 6.1 which was repeated and returned as 3.3.  WBC 7.2, hemoglobin 11.5, platelets  288,000, sodium 136, bicarb 25, BUN 19, creatinine 1.18, serum glucose 184, AST 55, ALT 21, alk phos 73, total bilirubin 1.5, lipase 48.  High-sensitivity troponin I <2 then 3 on repeat.  SARS-CoV-2 PCR and influenza are negative.  Portable chest x-ray negative for focal consolidation, edema, or effusion.  CTA chest PE study is negative for evidence of PE or other acute finding.  Focus of coronary calcification of the proximal LAD noted.  Patient was given 1 L normal saline, IV morphine 4 mg x 2, IV Zofran, oral Tylenol, sublingual nitroglycerin x1.  The hospitalist service was consulted to admit for further evaluation and management.  Review of Systems: All systems reviewed and are negative except as documented in history of present illness above.   Past Medical History:  Diagnosis Date   Diabetes mellitus    High cholesterol    Hypertension    Seasonal allergies     Past Surgical History:  Procedure Laterality Date   ABDOMINAL HYSTERECTOMY     TUBAL LIGATION      Social History:  reports that she has never smoked. She has never used smokeless tobacco. She reports that she does not drink alcohol and does not use drugs.  Allergies  Allergen Reactions   Penicillins Itching and Rash    Did it involve swelling of the face/tongue/throat, SOB, or low BP? No Did it involve sudden or severe rash/hives, skin peeling, or any reaction on the inside of your mouth or nose? No Did you need to seek medical attention at a hospital or  doctor's office? No When did it last happen? unk      If all above answers are "NO", may proceed with cephalosporin use.    Lisinopril Cough    Family History  Problem Relation Age of Onset   Diabetes Mother    Diabetes Father      Prior to Admission medications   Medication Sig Start Date End Date Taking? Authorizing Provider  acyclovir (ZOVIRAX) 400 MG tablet 1 tab by mouth twice daily. 07/22/19   Mack Hook, MD  atorvastatin (LIPITOR) 40  MG tablet Take 40 mg by mouth daily. 09/12/18   [provider]  Azelastine HCl 0.15 % SOLN Place into the nose. 2 sprays in each nostril daily 01/04/19   [provider]  cyclobenzaprine (FLEXERIL) 5 MG tablet Take 1 tablet (5 mg total) by mouth at bedtime. 12/01/18   Zigmund Gottron, NP  dapagliflozin propanediol (FARXIGA) 10 MG TABS tablet Take 10 mg by mouth daily. 11/04/18   Jola Schmidt, MD  Dulaglutide 1.5 MG/0.5ML SOPN Inject 1.5 mg into the skin once a week. 11/04/18   Jola Schmidt, MD  fenofibrate 160 MG tablet Take 160 mg by mouth daily. 10/14/18   [provider]  fluconazole (DIFLUCAN) 150 MG tablet 1 tab by mouth daily for 3 days. 07/22/19   Mack Hook, MD  fluticasone (FLONASE) 50 MCG/ACT nasal spray USE 2 SPRAYS IN [facility] NOSTRIL DAILY 06/12/19   [provider]  glipiZIDE (GLUCOTROL) 10 MG tablet 1 tab by mouth twice daily with meals 07/23/19   Mack Hook, MD  glucose blood (ONETOUCH VERIO) test strip Check blood glucose twice daily before meals 07/23/19   Mack Hook, MD  ipratropium (ATROVENT) 0.06 % nasal spray USE 2 SPRAYS NASALLY TWICE DAILY 06/12/19   [provider]  Lancets (ONETOUCH ULTRASOFT) lancets Check blood glucose twice daily before meals 07/23/19   Mack Hook, MD  losartan (COZAAR) 50 MG tablet 1/2 tab by mouth daily 07/22/19   Mack Hook, MD  meloxicam (MOBIC) 15 MG tablet Take 1 tablet (15 mg total) by mouth daily. 11/06/19   Ward, Delice Bison, DO  metFORMIN (GLUCOPHAGE-XR) 500 MG 24 hr tablet 2 tabs by mouth twice daily with meals 07/23/19   Mack Hook, MD  methocarbamol (ROBAXIN) 500 MG tablet Take 1 tablet (500 mg total) by mouth every 8 (eight) hours as needed for muscle spasms. 11/06/19   Ward, Delice Bison, DO  metoprolol tartrate (LOPRESSOR) 50 MG tablet Take 1 tablet (50 mg total) by mouth 2 (two) times daily. 07/22/19   Mack Hook, MD  pantoprazole (PROTONIX) 40 MG  tablet Take by mouth daily.  02/06/19   [provider]  pioglitazone (ACTOS) 15 MG tablet Take 2 tablets (30 mg total) by mouth daily. 11/04/18   Jola Schmidt, MD    Physical Exam: Vitals:   05/31/21 1500 05/31/21 1600 05/31/21 1700 05/31/21 1842  BP: 126/82 133/89 (!) 120/91 130/85  Pulse: 100 97 95 93  Resp: 20 20 15 14   Temp:    98 F (36.7 C)  TempSrc:    Oral  SpO2: 96% 98% 98% 99%  Weight:      Height:       Constitutional: Resting supine in bed, NAD, calm, comfortable Eyes: PERRL, lids and conjunctivae normal ENMT: Mucous membranes are moist. Posterior pharynx clear of any exudate or lesions.Normal dentition.  Neck: normal, supple, no masses. Respiratory: clear to auscultation bilaterally, no wheezing, no crackles. Normal respiratory effort. No accessory muscle  use.  Cardiovascular: Regular rate and rhythm, no murmurs / rubs / gallops. No extremity edema. 2+ pedal pulses. Abdomen: mild epigastric tenderness, no masses palpated. No hepatosplenomegaly. Bowel sounds positive.  Musculoskeletal: no clubbing / cyanosis. No joint deformity upper and lower extremities. Good ROM, no contractures. Normal muscle tone.  Skin: no rashes, lesions, ulcers. No induration Neurologic: CN 2-12 grossly intact. Sensation intact. Strength 5/5 in all 4.  Psychiatric: Normal judgment and insight. Alert and oriented x 3. Normal mood.   Labs on Admission: I have personally reviewed following labs and imaging studies  CBC: Recent Labs  Lab 05/31/21 0145  WBC 7.2  NEUTROABS 3.8  HGB 11.5*  HCT 33.9*  MCV 86.7  PLT 846   Basic Metabolic Panel: Recent Labs  Lab 05/31/21 0145 05/31/21 0357  NA 136  --   K 6.1* 3.3*  CL 103  --   CO2 25  --   GLUCOSE 184*  --   BUN 19  --   CREATININE 1.18*  --   CALCIUM 9.6  --    GFR: Estimated Creatinine Clearance: 58.6 mL/min (A) (by C-G formula based on SCr of 1.18 mg/dL (H)). Liver Function Tests: Recent Labs  Lab 05/31/21 0145   AST 55*  ALT 21  ALKPHOS 73  BILITOT 1.5*  PROT 7.6  ALBUMIN 3.9   Recent Labs  Lab 05/31/21 0145  LIPASE 48   No results for input(s): AMMONIA in the last 168 hours. Coagulation Profile: No results for input(s): INR, PROTIME in the last 168 hours. Cardiac Enzymes: No results for input(s): CKTOTAL, CKMB, CKMBINDEX, TROPONINI in the last 168 hours. BNP (last 3 results) No results for input(s): PROBNP in the last 8760 hours. HbA1C: No results for input(s): HGBA1C in the last 72 hours. CBG: Recent Labs  Lab 05/31/21 1719  GLUCAP 128*   Lipid Profile: No results for input(s): CHOL, HDL, LDLCALC, TRIG, CHOLHDL, LDLDIRECT in the last 72 hours. Thyroid Function Tests: No results for input(s): TSH, T4TOTAL, FREET4, T3FREE, THYROIDAB in the last 72 hours. Anemia Panel: No results for input(s): VITAMINB12, FOLATE, FERRITIN, TIBC, IRON, RETICCTPCT in the last 72 hours. Urine analysis:    Component Value Date/Time   COLORURINE STRAW (A) 11/04/2018 0738   APPEARANCEUR CLEAR 11/04/2018 0738   LABSPEC 1.024 11/04/2018 0738   PHURINE 7.0 11/04/2018 0738   GLUCOSEU >=500 (A) 11/04/2018 0738   HGBUR NEGATIVE 11/04/2018 0738   BILIRUBINUR NEGATIVE 11/04/2018 0738   KETONESUR NEGATIVE 11/04/2018 0738   PROTEINUR NEGATIVE 11/04/2018 0738   UROBILINOGEN 0.2 02/10/2013 2039   NITRITE NEGATIVE 11/04/2018 0738   LEUKOCYTESUR SMALL (A) 11/04/2018 0738    Radiological Exams on Admission: CT Angio Chest PE W and/or Wo Contrast  Result Date: 05/31/2021 CLINICAL DATA:  High probability for pulmonary embolism. Central chest pain on and off for the last week with shortness of breath, nausea, and vomiting EXAM: CT ANGIOGRAPHY CHEST WITH CONTRAST TECHNIQUE: Multidetector CT imaging of the chest was performed using the standard protocol during bolus administration of intravenous contrast. Multiplanar CT image reconstructions and MIPs were obtained to evaluate the vascular anatomy. CONTRAST:   13m OMNIPAQUE IOHEXOL 350 MG/ML SOLN COMPARISON:  02/01/2019 FINDINGS: Cardiovascular: Suboptimal but satisfactory opacification of the pulmonary arteries to the segmental level. No evidence of pulmonary embolism. Normal heart size. No pericardial effusion. At least 1 focus of coronary calcification at the proximal LAD. Mediastinum/Nodes: Negative for adenopathy or mass. Lungs/Pleura: Lungs are clear. No pleural effusion or pneumothorax. Calcified granuloma in  the right lower lung. Upper Abdomen: Negative for the arterial phase Musculoskeletal: No acute or aggressive finding. Degenerative spondylitic spurring. Review of the MIP images confirms the above findings. IMPRESSION: No evidence of pulmonary embolism or other acute finding. Electronically Signed   By: Jorje Guild M.D.   On: 05/31/2021 04:44   DG Chest Port 1 View  Result Date: 05/31/2021 CLINICAL DATA:  Chest pain, dyspnea EXAM: PORTABLE CHEST 1 VIEW COMPARISON:  05/27/2018 FINDINGS: The heart size and mediastinal contours are within normal limits. Both lungs are clear. The visualized skeletal structures are unremarkable. IMPRESSION: No active disease. Electronically Signed   By: Fidela Salisbury M.D.   On: 05/31/2021 02:22    EKG: Personally reviewed. Normal sinus rhythm, low voltage, nonspecific T wave changes inferolateral leads.  T wave changes more pronounced when compared to prior.  Assessment/Plan Principal Problem:   Chest pain Active Problems:   Type 2 diabetes mellitus without complication, without long-term current use of insulin (HCC)   Hypertension associated with diabetes ([place])   Hyperlipidemia associated with type 2 diabetes mellitus (HCC)   Nausea and vomiting   Marguarite Markov is a 55 y.o. female with medical history significant for type 2 diabetes, hypertension, hyperlipidemia, GERD, lumbar radiculopathy who is admitted for evaluation of nausea and vomiting.  Atypical chest pain/nausea/vomiting: Patient reports chest  pain but history is more suggestive of upper GI symptoms likely gastritis/dyspepsia +/-gastroparesis in setting of recently increased Trulicity.  She is still feeling nauseous but wants to try eating. -Start IV Protonix 40 mg twice daily -Start scheduled IV Reglan 10 mg every 8 hours, Zofran as needed -Start IV fluid hydration overnight -Trial GI cocktail -Advance diet as tolerated  Type 2 diabetes: Last A1c 8.8% 04/07/2021.  Holding home meds, placed on sensitive SSI.  Hypertension: Blood pressure currently stable, resume home meds pending medication reconciliation.  Hyperlipidemia: Continue atorvastatin.  DVT prophylaxis: Lovenox Code Status: Full code, confirmed with patient Family Communication: Discussed with patient's husband at bedside Disposition Plan: From home and likely discharge to home pending clinical progress Consults called: None Level of care: Telemetry Cardiac Admission status:  Status is: Observation  The patient remains OBS appropriate and will d/c before 2 midnights.  Dispo: The patient is from: Home              Anticipated d/c is to: Home              Patient currently is not medically stable to d/c.   Difficult to place patient No  Zada Finders MD [facility]  If 7PM-7AM, please contact night-coverage [email or web address]  05/31/2021, 6:50 PM

## 2021-06-01 ENCOUNTER — Other Ambulatory Visit (HOSPITAL_COMMUNITY): Payer: Self-pay

## 2021-06-01 ENCOUNTER — Observation Stay (HOSPITAL_BASED_OUTPATIENT_CLINIC_OR_DEPARTMENT_OTHER): Payer: Self-pay

## 2021-06-01 ENCOUNTER — Encounter (HOSPITAL_COMMUNITY): Payer: Self-pay | Admitting: Internal Medicine

## 2021-06-01 DIAGNOSIS — R079 Chest pain, unspecified: Secondary | ICD-10-CM

## 2021-06-01 LAB — ECHOCARDIOGRAM COMPLETE
Area-P 1/2: 5.75 cm2
Height: 64 in
S' Lateral: 2.9 cm
Weight: 3040 oz

## 2021-06-01 LAB — CBC
HCT: 33.2 % — ABNORMAL LOW (ref 36.0–46.0)
Hemoglobin: 10.7 g/dL — ABNORMAL LOW (ref 12.0–15.0)
MCH: 28.9 pg (ref 26.0–34.0)
MCHC: 32.2 g/dL (ref 30.0–36.0)
MCV: 89.7 fL (ref 80.0–100.0)
Platelets: 296 10*3/uL (ref 150–400)
RBC: 3.7 MIL/uL — ABNORMAL LOW (ref 3.87–5.11)
RDW: 14.6 % (ref 11.5–15.5)
WBC: 6 10*3/uL (ref 4.0–10.5)
nRBC: 0 % (ref 0.0–0.2)

## 2021-06-01 LAB — BASIC METABOLIC PANEL
Anion gap: 7 (ref 5–15)
BUN: 11 mg/dL (ref 6–20)
CO2: 24 mmol/L (ref 22–32)
Calcium: 8.9 mg/dL (ref 8.9–10.3)
Chloride: 106 mmol/L (ref 98–111)
Creatinine, Ser: 0.72 mg/dL (ref 0.44–1.00)
GFR, Estimated: >60 mL/min (ref >60)
Glucose, Bld: 121 mg/dL — ABNORMAL HIGH (ref 70–99)
Potassium: 4 mmol/L (ref 3.5–5.1)
Sodium: 137 mmol/L (ref 135–145)

## 2021-06-01 LAB — HIV ANTIBODY (ROUTINE TESTING W REFLEX): HIV Screen 4th Generation wRfx: NONREACTIVE

## 2021-06-01 LAB — MAGNESIUM: Magnesium: 1.9 mg/dL (ref 1.7–2.4)

## 2021-06-01 LAB — GLUCOSE, CAPILLARY
Glucose-Capillary: 105 mg/dL — ABNORMAL HIGH (ref 70–99)
Glucose-Capillary: 148 mg/dL — ABNORMAL HIGH (ref 70–99)

## 2021-06-01 MED ORDER — ONDANSETRON HCL 4 MG PO TABS
4.0000 mg | ORAL_TABLET | Freq: Three times a day (TID) | ORAL | 1 refills | Status: AC | PRN
  Filled 2021-06-01: qty 30, 10d supply, fill #0

## 2021-06-01 MED ORDER — PANTOPRAZOLE SODIUM 40 MG PO TBEC
40.0000 mg | DELAYED_RELEASE_TABLET | Freq: Two times a day (BID) | ORAL | 11 refills | Status: AC
  Filled 2021-06-01: qty 60, 30d supply, fill #0

## 2021-06-01 NOTE — Discharge Summary (Signed)
Physician Discharge Summary  Kiara Hobbs ZOX:[id number] DOB: 1966-09-06 DOA: 05/31/2021  PCP: Julieanne Manson, MD  Admit date: 05/31/2021 Discharge date: 06/01/2021  Admitted From: Home Disposition: Home  Recommendations for Outpatient Follow-up:  Follow up with PCP in 1-2 weeks Please obtain BMP/CBC in one week   Home Health: N/A Equipment/Devices: N/A  Discharge Condition: Stable CODE STATUS: Full code Diet recommendation: Low-salt low-carb diet.  Discharge summary: 55 year old female with history of type 2 diabetes on insulin, hypertension, hyperlipidemia, significant history of GERD and lumbar radiculopathy presented with epigastric pain, chest pain, nausea and vomiting reportedly after increasing dose of Trulicity at home.  Does have history of esophageal dilation.  Also noticed early satiety.  Admitted for observation.  Serial EKGs and troponins nonischemic.  2D echocardiogram with no evidence of wall motion abnormalities.  Symptomatology consistent with GERD and gastroparesis.  Treated with Protonix with improvement.  CT scan of the chest negative for pulmonary embolism.  Plan: Symptomatically improved.  Resume all home medications. Discharged with Protonix 40 mg twice a day, Zofran as needed.  Currently tolerating regular diet.  Unlikely that increased dose of Trulicity caused gastroparesis, she will discuss with her endocrinologist.   Discharge Diagnoses:  Principal Problem:   Chest pain Active Problems:   Type 2 diabetes mellitus without complication, without long-term current use of insulin (HCC)   Hypertension associated with diabetes (HCC)   Hyperlipidemia associated with type 2 diabetes mellitus (HCC)   Nausea and vomiting    Discharge Instructions  Discharge Instructions     Call MD for:  persistant nausea and vomiting   Complete by: As directed    Diet - low sodium heart healthy   Complete by: As directed    Diet Carb Modified   Complete by: As  directed    Increase activity slowly   Complete by: As directed       Allergies as of 06/01/2021       Reactions   Penicillins Itching, Rash   Did it involve swelling of the face/tongue/throat, SOB, or low BP? No Did it involve sudden or severe rash/hives, skin peeling, or any reaction on the inside of your mouth or nose? No Did you need to seek medical attention at a hospital or doctor's office? No When did it last happen? unk      If all above answers are "NO", may proceed with cephalosporin use.   Lisinopril Cough        Medication List     STOP taking these medications    dapagliflozin propanediol 10 MG Tabs tablet Commonly known as: Farxiga   glipiZIDE 10 MG tablet Commonly known as: GLUCOTROL       TAKE these medications    atorvastatin 40 MG tablet Commonly known as: LIPITOR Take 40 mg by mouth daily.   fenofibrate 160 MG tablet Take 160 mg by mouth daily.   fluticasone 50 MCG/ACT nasal spray Commonly known as: FLONASE Place 2 sprays into both nostrils daily as needed for allergies.   guaiFENesin-codeine 100-10 MG/5ML syrup Take 5 mLs by mouth 3 (three) times daily as needed for cough.   losartan 25 MG tablet Commonly known as: COZAAR Take 25 mg by mouth daily. What changed: Another medication with the same name was removed. Continue taking this medication, and follow the directions you see here.   metFORMIN 500 MG 24 hr tablet Commonly known as: GLUCOPHAGE-XR 2 tabs by mouth twice daily with meals What changed:  how much to take how to  take this when to take this additional instructions   metoprolol tartrate 25 MG tablet Commonly known as: LOPRESSOR Take 25 mg by mouth 2 (two) times daily. What changed: Another medication with the same name was removed. Continue taking this medication, and follow the directions you see here.   ondansetron 4 MG tablet Commonly known as: Zofran Take 1 tablet (4 mg total) by mouth every 8 (eight) hours as  needed for nausea or vomiting.   onetouch ultrasoft lancets Check blood glucose twice daily before meals   OneTouch Verio test strip Generic drug: glucose blood Check blood glucose twice daily before meals   pantoprazole 40 MG tablet Commonly known as: Protonix Take 1 tablet (40 mg total) by mouth 2 (two) times daily.   pioglitazone 45 MG tablet Commonly known as: ACTOS Take 45 mg by mouth daily. What changed: Another medication with the same name was removed. Continue taking this medication, and follow the directions you see here.   Trulicity 4.5 MG/0.5ML Sopn Generic drug: Dulaglutide Inject 0.5 mLs into the skin once a week. What changed: Another medication with the same name was removed. Continue taking this medication, and follow the directions you see here.        Follow-up Information     Julieanne Manson, MD Follow up in 2 week(s).   Specialty: Internal Medicine Contact information: [address] [place] [address] [phone]                Allergies  Allergen Reactions   Penicillins Itching and Rash    Did it involve swelling of the face/tongue/throat, SOB, or low BP? No Did it involve sudden or severe rash/hives, skin peeling, or any reaction on the inside of your mouth or nose? No Did you need to seek medical attention at a hospital or doctor's office? No When did it last happen? unk      If all above answers are "NO", may proceed with cephalosporin use.    Lisinopril Cough    Consultations: None   Procedures/Studies: CT Angio Chest PE W and/or Wo Contrast  Result Date: 05/31/2021 CLINICAL DATA:  High probability for pulmonary embolism. Central chest pain on and off for the last week with shortness of breath, nausea, and vomiting EXAM: CT ANGIOGRAPHY CHEST WITH CONTRAST TECHNIQUE: Multidetector CT imaging of the chest was performed using the standard protocol during bolus administration of intravenous contrast. Multiplanar CT image  reconstructions and MIPs were obtained to evaluate the vascular anatomy. CONTRAST:  OMNIPAQUE IOHEXOL 350 MG/ML SOLN COMPARISON:  02/01/2019 FINDINGS: Cardiovascular: Suboptimal but satisfactory opacification of the pulmonary arteries to the segmental level. No evidence of pulmonary embolism. Normal heart size. No pericardial effusion. At least 1 focus of coronary calcification at the proximal LAD. Mediastinum/Nodes: Negative for adenopathy or mass. Lungs/Pleura: Lungs are clear. No pleural effusion or pneumothorax. Calcified granuloma in the right lower lung. Upper Abdomen: Negative for the arterial phase Musculoskeletal: No acute or aggressive finding. Degenerative spondylitic spurring. Review of the MIP images confirms the above findings. IMPRESSION: No evidence of pulmonary embolism or other acute finding. Electronically Signed   By: Tiburcio Pea M.D.   On: 05/31/2021 04:44   DG Chest Port 1 View  Result Date: 05/31/2021 CLINICAL DATA:  Chest pain, dyspnea EXAM: PORTABLE CHEST 1 VIEW COMPARISON:  05/27/2018 FINDINGS: The heart size and mediastinal contours are within normal limits. Both lungs are clear. The visualized skeletal structures are unremarkable. IMPRESSION: No active disease. Electronically Signed   By:  Helyn Numbers M.D.   On: 05/31/2021 02:22   ECHOCARDIOGRAM COMPLETE  Result Date: 06/01/2021    ECHOCARDIOGRAM REPORT   Patient Name:   Kiara Hobbs Date of Exam: 06/01/2021 Medical Rec #:  [id number]     Height:       64.0 in Accession #:    [id number]    Weight:       190.0 lb Date of Birth:  23-Oct-1965     BSA:          1.914 m Patient Age:    54 years      BP:           116/63 mmHg Patient Gender: F             HR:           93 bpm. Exam Location:  Inpatient Procedure: 2D Echo, Cardiac Doppler and Color Doppler Indications:    R07.9* Chest pain, unspecified  History:        Patient has no prior history of Echocardiogram examinations.                 Risk Factors:Diabetes and  Hypertension.  Sonographer:    Eulah Pont RDCS Referring Phys: [id number] Dorcas Carrow IMPRESSIONS  1. Left ventricular ejection fraction, by estimation, is 60 to 65%. The left ventricle has normal function. The left ventricle has no regional wall motion abnormalities. Left ventricular diastolic parameters are indeterminate.  2. Right ventricular systolic function is normal. The right ventricular size is normal. There is normal pulmonary artery systolic pressure.  3. The mitral valve is normal in structure. Trivial mitral valve regurgitation. No evidence of mitral stenosis.  4. The aortic valve is normal in structure. Aortic valve regurgitation is not visualized. No aortic stenosis is present.  5. The inferior vena cava is normal in size with greater than 50% respiratory variability, suggesting right atrial pressure of 3 mmHg. FINDINGS  Left Ventricle: Left ventricular ejection fraction, by estimation, is 60 to 65%. The left ventricle has normal function. The left ventricle has no regional wall motion abnormalities. The left ventricular internal cavity size was normal in size. There is  no left ventricular hypertrophy. Left ventricular diastolic parameters are indeterminate. Normal left ventricular filling pressure. Right Ventricle: The right ventricular size is normal. No increase in right ventricular wall thickness. Right ventricular systolic function is normal. There is normal pulmonary artery systolic pressure. The tricuspid regurgitant velocity is 2.20 m/s, and  with an assumed right atrial pressure of 3 mmHg, the estimated right ventricular systolic pressure is 22.4 mmHg. Left Atrium: Left atrial size was normal in size. Right Atrium: Right atrial size was normal in size. Pericardium: There is no evidence of pericardial effusion. Mitral Valve: The mitral valve is normal in structure. Trivial mitral valve regurgitation. No evidence of mitral valve stenosis. Tricuspid Valve: The tricuspid valve is normal in  structure. Tricuspid valve regurgitation is trivial. No evidence of tricuspid stenosis. Aortic Valve: The aortic valve is normal in structure. Aortic valve regurgitation is not visualized. No aortic stenosis is present. Pulmonic Valve: The pulmonic valve was normal in structure. Pulmonic valve regurgitation is not visualized. No evidence of pulmonic stenosis. Aorta: The aortic root is normal in size and structure. Venous: The inferior vena cava is normal in size with greater than 50% respiratory variability, suggesting right atrial pressure of 3 mmHg. IAS/Shunts: No atrial level shunt detected by color flow Doppler.  LEFT VENTRICLE PLAX 2D LVIDd:  4.50 cm  Diastology LVIDs:         2.90 cm  LV e' medial:    6.39 cm/s LV PW:         1.10 cm  LV E/e' medial:  14.6 LV IVS:        0.80 cm  LV e' lateral:   8.64 cm/s LVOT diam:     1.90 cm  LV E/e' lateral: 10.8 LV SV:         57 LV SV Index:   30 LVOT Area:     2.84 cm  RIGHT VENTRICLE RV S prime:     18.80 cm/s TAPSE (M-mode): 2.4 cm LEFT ATRIUM             Index       RIGHT ATRIUM          Index LA diam:        3.70 cm 1.93 cm/m  RA Area:     9.46 cm LA Vol (A2C):   23.0 ml 12.02 ml/m RA Volume:   17.10 ml 8.93 ml/m LA Vol (A4C):   20.4 ml 10.66 ml/m LA Biplane Vol: 22.6 ml 11.81 ml/m  AORTIC VALVE LVOT Vmax:   95.70 cm/s LVOT Vmean:  63.700 cm/s LVOT VTI:    0.202 m  AORTA Ao Root diam: 2.80 cm Ao Asc diam:  2.90 cm MITRAL VALVE               TRICUSPID VALVE MV Area (PHT): 5.75 cm    TR Peak grad:   19.4 mmHg MV Decel Time: 132 msec    TR Vmax:        220.00 cm/s MV E velocity: 93.30 cm/s MV A velocity: 83.30 cm/s  SHUNTS MV E/A ratio:  1.12        Systemic VTI:  0.20 m                            Systemic Diam: 1.90 cm Armanda Magic MD Electronically signed by Armanda Magic MD Signature Date/Time: 06/01/2021/1:56:07 PM    Final    (Echo, Carotid, EGD, Colonoscopy, ERCP)    Subjective: Patient seen and examined.  Some relief of symptoms after using  Protonix overnight.  She tells me to prescribe some stronger acid medicine that will help her.  Denies any chest pain.  Pain is mostly on the epigastrium and retrosternal area after eating.   Discharge Exam: Vitals:   06/01/21 0700 06/01/21 1059  BP: 136/78 116/63  Pulse: 91   Resp: 18 11  Temp: 97.8 F (36.6 C) 97.8 F (36.6 C)  SpO2: 95% 96%   Vitals:   05/31/21 2333 06/01/21 0349 06/01/21 0700 06/01/21 1059  BP: 126/78 124/82 136/78 116/63  Pulse: 87 88 91   Resp: 10 18 18 11   Temp: 97.7 F (36.5 C) 97.9 F (36.6 C) 97.8 F (36.6 C) 97.8 F (36.6 C)  TempSrc: Oral Oral  Oral  SpO2: 95% 93% 95% 96%  Weight:      Height:        General: Pt is alert, awake, not in acute distress Cardiovascular: RRR, S1/S2 +, no rubs, no gallops Respiratory: CTA bilaterally, no wheezing, no rhonchi Abdominal: Soft, NT, ND, bowel sounds + Extremities: no edema, no cyanosis    The results of significant diagnostics from this hospitalization (including imaging, microbiology, ancillary and laboratory) are listed below for reference.     Microbiology: Recent  Results (from the past 240 hour(s))  Resp Panel by RT-PCR (Flu A&B, Covid) Nasopharyngeal Swab     Status: None   Collection Time: 05/31/21  1:54 AM   Specimen: Nasopharyngeal Swab; Nasopharyngeal(NP) swabs in vial transport medium  Result Value Ref Range Status   SARS Coronavirus 2 by RT PCR NEGATIVE NEGATIVE Final    Comment: (NOTE) SARS-CoV-2 target nucleic acids are NOT DETECTED.  The SARS-CoV-2 RNA is generally detectable in upper respiratory specimens during the acute phase of infection. The lowest concentration of SARS-CoV-2 viral copies this assay can detect is 138 copies/mL. A negative result does not preclude SARS-Cov-2 infection and should not be used as the sole basis for treatment or other patient management decisions. A negative result may occur with  improper specimen collection/handling, submission of specimen  other than nasopharyngeal swab, presence of viral mutation(s) within the areas targeted by this assay, and inadequate number of viral copies(<138 copies/mL). A negative result must be combined with clinical observations, patient history, and epidemiological information. The expected result is Negative.  Fact Sheet for Patients:  BloggerCourse.com  Fact Sheet for Healthcare Providers:  SeriousBroker.it  This test is no t yet approved or cleared by the [place] FDA and  has been authorized for detection and/or diagnosis of SARS-CoV-2 by FDA under an Emergency Use Authorization (EUA). This EUA will remain  in effect (meaning this test can be used) for the duration of the COVID-19 declaration under Section 564(b)(1) of the Act, 21 U.S.C.section 360bbb-3(b)(1), unless the authorization is terminated  or revoked sooner.       Influenza A by PCR NEGATIVE NEGATIVE Final   Influenza B by PCR NEGATIVE NEGATIVE Final    Comment: (NOTE) The Xpert Xpress SARS-CoV-2/FLU/RSV plus assay is intended as an aid in the diagnosis of influenza from Nasopharyngeal swab specimens and should not be used as a sole basis for treatment. Nasal washings and aspirates are unacceptable for Xpert Xpress SARS-CoV-2/FLU/RSV testing.  Fact Sheet for Patients: BloggerCourse.com  Fact Sheet for Healthcare Providers: SeriousBroker.it  This test is not yet approved or cleared by the [place] FDA and has been authorized for detection and/or diagnosis of SARS-CoV-2 by FDA under an Emergency Use Authorization (EUA). This EUA will remain in effect (meaning this test can be used) for the duration of the COVID-19 declaration under Section 564(b)(1) of the Act, 21 U.S.C. section 360bbb-3(b)(1), unless the authorization is terminated or revoked.  Performed at [facility], [address]  [place], [place] [address]   MRSA Next Gen by PCR, Nasal     Status: None   Collection Time: 05/31/21  7:07 PM   Specimen: Nasal Mucosa; Nasal Swab  Result Value Ref Range Status   MRSA by PCR Next Gen NOT DETECTED NOT DETECTED Final    Comment: (NOTE) The GeneXpert MRSA Assay (FDA approved for NASAL specimens only), is one component of a comprehensive MRSA colonization surveillance program. It is not intended to diagnose MRSA infection nor to guide or monitor treatment for MRSA infections. Test performance is not FDA approved in patients less than 53 years old. Performed at [facility], [address] [place], [place] [address]      Labs: BNP (last 3 results) No results for input(s): BNP in the last 8760 hours. Basic Metabolic Panel: Recent Labs  Lab 05/31/21 0145 05/31/21 0357 06/01/21 0039  NA 136  --  137  K 6.1* 3.3* 4.0  CL 103  --  106  CO2 25  --  24  GLUCOSE 184*  --  121*  BUN 19  --  11  CREATININE 1.18*  --  0.72  CALCIUM 9.6  --  8.9  MG  --   --  1.9   Liver Function Tests: Recent Labs  Lab 05/31/21 0145  AST 55*  ALT 21  ALKPHOS 73  BILITOT 1.5*  PROT 7.6  ALBUMIN 3.9   Recent Labs  Lab 05/31/21 0145  LIPASE 48   No results for input(s): AMMONIA in the last 168 hours. CBC: Recent Labs  Lab 05/31/21 0145 06/01/21 0039  WBC 7.2 6.0  NEUTROABS 3.8  --   HGB 11.5* 10.7*  HCT 33.9* 33.2*  MCV 86.7 89.7  PLT 288 296   Cardiac Enzymes: No results for input(s): CKTOTAL, CKMB, CKMBINDEX, TROPONINI in the last 168 hours. BNP: Invalid input(s): POCBNP CBG: Recent Labs  Lab 05/31/21 1719 05/31/21 2105 06/01/21 0601 06/01/21 1058  GLUCAP 128* 179* 105* 148*   D-Dimer No results for input(s): DDIMER in the last 72 hours. Hgb A1c No results for input(s): HGBA1C in the last 72 hours. Lipid Profile No results for input(s): CHOL, HDL, LDLCALC, TRIG, CHOLHDL, LDLDIRECT in the last 72 hours. Thyroid function studies No results for  input(s): TSH, T4TOTAL, T3FREE, THYROIDAB in the last 72 hours.  Invalid input(s): FREET3 Anemia work up No results for input(s): VITAMINB12, FOLATE, FERRITIN, TIBC, IRON, RETICCTPCT in the last 72 hours. Urinalysis    Component Value Date/Time   COLORURINE STRAW (A) 11/04/2018 0738   APPEARANCEUR CLEAR 11/04/2018 0738   LABSPEC 1.024 11/04/2018 0738   PHURINE 7.0 11/04/2018 0738   GLUCOSEU >=500 (A) 11/04/2018 0738   HGBUR NEGATIVE 11/04/2018 0738   BILIRUBINUR NEGATIVE 11/04/2018 0738   KETONESUR NEGATIVE 11/04/2018 0738   PROTEINUR NEGATIVE 11/04/2018 0738   UROBILINOGEN 0.2 02/10/2013 2039   NITRITE NEGATIVE 11/04/2018 0738   LEUKOCYTESUR SMALL (A) 11/04/2018 0738   Sepsis Labs Invalid input(s): PROCALCITONIN,  WBC,  LACTICIDVEN Microbiology Recent Results (from the past 240 hour(s))  Resp Panel by RT-PCR (Flu A&B, Covid) Nasopharyngeal Swab     Status: None   Collection Time: 05/31/21  1:54 AM   Specimen: Nasopharyngeal Swab; Nasopharyngeal(NP) swabs in vial transport medium  Result Value Ref Range Status   SARS Coronavirus 2 by RT PCR NEGATIVE NEGATIVE Final    Comment: (NOTE) SARS-CoV-2 target nucleic acids are NOT DETECTED.  The SARS-CoV-2 RNA is generally detectable in upper respiratory specimens during the acute phase of infection. The lowest concentration of SARS-CoV-2 viral copies this assay can detect is 138 copies/mL. A negative result does not preclude SARS-Cov-2 infection and should not be used as the sole basis for treatment or other patient management decisions. A negative result may occur with  improper specimen collection/handling, submission of specimen other than nasopharyngeal swab, presence of viral mutation(s) within the areas targeted by this assay, and inadequate number of viral copies(<138 copies/mL). A negative result must be combined with clinical observations, patient history, and epidemiological information. The expected result is  Negative.  Fact Sheet for Patients:  BloggerCourse.com  Fact Sheet for Healthcare Providers:  SeriousBroker.it  This test is no t yet approved or cleared by the [place] FDA and  has been authorized for detection and/or diagnosis of SARS-CoV-2 by FDA under an Emergency Use Authorization (EUA). This EUA will remain  in effect (meaning this test can be used) for the duration of the COVID-19 declaration under Section 564(b)(1) of the  Act, 21 U.S.C.section 360bbb-3(b)(1), unless the authorization is terminated  or revoked sooner.       Influenza A by PCR NEGATIVE NEGATIVE Final   Influenza B by PCR NEGATIVE NEGATIVE Final    Comment: (NOTE) The Xpert Xpress SARS-CoV-2/FLU/RSV plus assay is intended as an aid in the diagnosis of influenza from Nasopharyngeal swab specimens and should not be used as a sole basis for treatment. Nasal washings and aspirates are unacceptable for Xpert Xpress SARS-CoV-2/FLU/RSV testing.  Fact Sheet for Patients: BloggerCourse.com  Fact Sheet for Healthcare Providers: SeriousBroker.it  This test is not yet approved or cleared by the [place] FDA and has been authorized for detection and/or diagnosis of SARS-CoV-2 by FDA under an Emergency Use Authorization (EUA). This EUA will remain in effect (meaning this test can be used) for the duration of the COVID-19 declaration under Section 564(b)(1) of the Act, 21 U.S.C. section 360bbb-3(b)(1), unless the authorization is terminated or revoked.  Performed at [facility], [address] [place], [place] [address]   MRSA Next Gen by PCR, Nasal     Status: None   Collection Time: 05/31/21  7:07 PM   Specimen: Nasal Mucosa; Nasal Swab  Result Value Ref Range Status   MRSA by PCR Next Gen NOT DETECTED NOT DETECTED Final    Comment: (NOTE) The GeneXpert MRSA Assay (FDA approved for NASAL  specimens only), is one component of a comprehensive MRSA colonization surveillance program. It is not intended to diagnose MRSA infection nor to guide or monitor treatment for MRSA infections. Test performance is not FDA approved in patients less than 73 years old. Performed at [facility], [address] [place], [place] [address]      Time coordinating discharge:  28 minutes  SIGNED:   Dorcas Carrow, MD  [facility] 06/01/2021, 3:01 PM

## 2021-06-01 NOTE — Plan of Care (Signed)
  Problem: Education: Goal: Knowledge of General Education information will improve Description: Including pain rating scale, medication(s)/side effects and non-pharmacologic comfort measures 06/01/2021 1534 by Marlyn Corporal, RN Outcome: Adequate for Discharge 06/01/2021 1534 by Marlyn Corporal, RN Outcome: Adequate for Discharge   Problem: Health Behavior/Discharge Planning: Goal: Ability to manage health-related needs will improve 06/01/2021 1534 by Marlyn Corporal, RN Outcome: Adequate for Discharge 06/01/2021 1534 by Marlyn Corporal, RN Outcome: Adequate for Discharge   Problem: Clinical Measurements: Goal: Ability to maintain clinical measurements within normal limits will improve 06/01/2021 1534 by Marlyn Corporal, RN Outcome: Adequate for Discharge 06/01/2021 1534 by Marlyn Corporal, RN Outcome: Adequate for Discharge Goal: Will remain free from infection 06/01/2021 1534 by Marlyn Corporal, RN Outcome: Adequate for Discharge 06/01/2021 1534 by Marlyn Corporal, RN Outcome: Adequate for Discharge Goal: Diagnostic test results will improve 06/01/2021 1534 by Marlyn Corporal, RN Outcome: Adequate for Discharge 06/01/2021 1534 by Marlyn Corporal, RN Outcome: Adequate for Discharge Goal: Respiratory complications will improve 06/01/2021 1534 by Marlyn Corporal, RN Outcome: Adequate for Discharge 06/01/2021 1534 by Marlyn Corporal, RN Outcome: Adequate for Discharge Goal: Cardiovascular complication will be avoided 06/01/2021 1534 by Marlyn Corporal, RN Outcome: Adequate for Discharge 06/01/2021 1534 by Marlyn Corporal, RN Outcome: Adequate for Discharge   Problem: Activity: Goal: Risk for activity intolerance will decrease 06/01/2021 1534 by Marlyn Corporal, RN Outcome: Adequate for Discharge 06/01/2021 1534 by Marlyn Corporal, RN Outcome: Adequate for Discharge   Problem: Nutrition: Goal: Adequate nutrition will be maintained 06/01/2021 1534 by Marlyn Corporal, RN Outcome: Adequate for Discharge 06/01/2021 1534 by Marlyn Corporal,  RN Outcome: Adequate for Discharge   Problem: Pain Managment: Goal: General experience of comfort will improve 06/01/2021 1534 by Marlyn Corporal, RN Outcome: Adequate for Discharge 06/01/2021 1534 by Marlyn Corporal, RN Outcome: Adequate for Discharge   Problem: Safety: Goal: Ability to remain free from injury will improve 06/01/2021 1534 by Marlyn Corporal, RN Outcome: Adequate for Discharge 06/01/2021 1534 by Marlyn Corporal, RN Outcome: Adequate for Discharge

## 2021-06-01 NOTE — Progress Notes (Signed)
  Echocardiogram 2D Echocardiogram has been performed.  Augustine Radar 06/01/2021, 12:17 PM

## 2021-06-01 NOTE — TOC Transition Note (Signed)
Transition of Care [facility]) - CM/SW Discharge Note   Patient Details  Name: Kiara Hobbs MRN: [id number] Date of Birth: 1965/10/23  Transition of Care [facility]) CM/SW Contact:  Leone Haven, RN Phone Number: 06/01/2021, 3:08 PM   Clinical Narrative:    NCM spoke with patient, she states she does not need any ast with medication cost, she would like to keep her PCP listed on file.  She states her insurance will kick in in a couple months. She has no other needs. TOC will fill meds.    Final next level of care: Home/Self Care Barriers to Discharge: No Barriers Identified   Patient Goals and CMS Choice        Discharge Placement                       Discharge Plan and Services                  DME Agency: NA       HH Arranged: NA          Social Determinants of Health (SDOH) Interventions     Readmission Risk Interventions No flowsheet data found.

## 2021-06-23 ENCOUNTER — Other Ambulatory Visit (HOSPITAL_COMMUNITY): Payer: Self-pay

## 2021-07-19 ENCOUNTER — Encounter (HOSPITAL_BASED_OUTPATIENT_CLINIC_OR_DEPARTMENT_OTHER): Payer: Self-pay

## 2021-07-19 ENCOUNTER — Other Ambulatory Visit: Payer: Self-pay

## 2021-07-19 ENCOUNTER — Emergency Department (HOSPITAL_BASED_OUTPATIENT_CLINIC_OR_DEPARTMENT_OTHER)
Admission: EM | Admit: 2021-07-19 | Discharge: 2021-07-19 | Disposition: A | Discharge status: Home / Self Care | Payer: Self-pay | Attending: Emergency Medicine | Admitting: Emergency Medicine

## 2021-07-19 DIAGNOSIS — I10 Essential (primary) hypertension: Secondary | ICD-10-CM | POA: Insufficient documentation

## 2021-07-19 DIAGNOSIS — Z7952 Long term (current) use of systemic steroids: Secondary | ICD-10-CM | POA: Insufficient documentation

## 2021-07-19 DIAGNOSIS — E1165 Type 2 diabetes mellitus with hyperglycemia: Secondary | ICD-10-CM | POA: Insufficient documentation

## 2021-07-19 DIAGNOSIS — R519 Headache, unspecified: Secondary | ICD-10-CM | POA: Insufficient documentation

## 2021-07-19 DIAGNOSIS — R739 Hyperglycemia, unspecified: Secondary | ICD-10-CM

## 2021-07-19 DIAGNOSIS — Z7984 Long term (current) use of oral hypoglycemic drugs: Secondary | ICD-10-CM | POA: Insufficient documentation

## 2021-07-19 DIAGNOSIS — Z79899 Other long term (current) drug therapy: Secondary | ICD-10-CM | POA: Insufficient documentation

## 2021-07-19 LAB — BASIC METABOLIC PANEL
Anion gap: 5 (ref 5–15)
BUN: 17 mg/dL (ref 6–20)
CO2: 30 mmol/L (ref 22–32)
Calcium: 9.2 mg/dL (ref 8.9–10.3)
Chloride: 103 mmol/L (ref 98–111)
Creatinine, Ser: 0.71 mg/dL (ref 0.44–1.00)
GFR, Estimated: >60 mL/min (ref >60)
Glucose, Bld: 247 mg/dL — ABNORMAL HIGH (ref 70–99)
Potassium: 4.4 mmol/L (ref 3.5–5.1)
Sodium: 138 mmol/L (ref 135–145)

## 2021-07-19 LAB — CBC WITH DIFFERENTIAL/PLATELET
Abs Immature Granulocytes: 0.02 10*3/uL (ref 0.00–0.07)
Basophils Absolute: 0 10*3/uL (ref 0.0–0.1)
Basophils Relative: 0 %
Eosinophils Absolute: 0.2 10*3/uL (ref 0.0–0.5)
Eosinophils Relative: 3 %
HCT: 36.8 % (ref 36.0–46.0)
Hemoglobin: 12.1 g/dL (ref 12.0–15.0)
Immature Granulocytes: 0 %
Lymphocytes Relative: 41 %
Lymphs Abs: 2.4 10*3/uL (ref 0.7–4.0)
MCH: 29.2 pg (ref 26.0–34.0)
MCHC: 32.9 g/dL (ref 30.0–36.0)
MCV: 88.7 fL (ref 80.0–100.0)
Monocytes Absolute: 0.5 10*3/uL (ref 0.1–1.0)
Monocytes Relative: 8 %
Neutro Abs: 2.9 10*3/uL (ref 1.7–7.7)
Neutrophils Relative %: 48 %
Platelets: 272 10*3/uL (ref 150–400)
RBC: 4.15 MIL/uL (ref 3.87–5.11)
RDW: 14 % (ref 11.5–15.5)
WBC: 6 10*3/uL (ref 4.0–10.5)
nRBC: 0 % (ref 0.0–0.2)

## 2021-07-19 LAB — CBG MONITORING, ED: Glucose-Capillary: 267 mg/dL — ABNORMAL HIGH (ref 70–99)

## 2021-07-19 NOTE — ED Provider Notes (Signed)
[place] EMERGENCY DEPARTMENT Provider Note   CSN: [id number] Arrival date & time: 07/19/21  1303     History Chief Complaint  Patient presents with   Hyperglycemia    Kiara Hobbs is a 55 y.o. female with PMHx HTN, high cholesterol, and diabetes who presents to the ED today with complaint of hyperglycemia for the past 3 months.  She reports she recently started a new job and has therefore been without insurance.  She states that she went without her Trulicity for a month.  She was able to get free samples at her PCPs office for the next month however again has been out for the past month.  She attempted to call them to ask for free samples however they did not have any.  Patient states that for the past month she has had persistent headaches.  She states that this is common when her blood sugar is up and she states it has been persistently in the 200s to 300s.  She is currently still taking metformin and Actos.  States she is tired of having headaches prompting ED visit today.  She complains of photophobia with same.  No nausea, vomiting, blurry vision, double vision, unilateral weakness or numbness.   The history is provided by the patient and medical records.      Past Medical History:  Diagnosis Date   Diabetes mellitus    High cholesterol    Hypertension    Seasonal allergies     Patient Active Problem List   Diagnosis Date Noted   Chest pain 05/31/2021   Hypertension associated with diabetes ([place]) 05/31/2021   Hyperlipidemia associated with type 2 diabetes mellitus ([place]) 05/31/2021   Nausea and vomiting 05/31/2021   Type 2 diabetes mellitus without complication, without long-term current use of insulin ([place]) 04/01/2016    Past Surgical History:  Procedure Laterality Date   ABDOMINAL HYSTERECTOMY     TUBAL LIGATION       OB History   No obstetric history on file.     Family History  Problem Relation Age of Onset   Diabetes Mother    Diabetes  Father     Social History   Tobacco Use   Smoking status: Never   Smokeless tobacco: Never  Vaping Use   Vaping Use: Never used  Substance Use Topics   Alcohol use: No   Drug use: No    Home Medications Prior to Admission medications   Medication Sig Start Date End Date Taking? Authorizing Provider  atorvastatin (LIPITOR) 40 MG tablet Take 40 mg by mouth daily. 09/12/18   [provider]  fenofibrate 160 MG tablet Take 160 mg by mouth daily. 10/14/18   [provider]  fluticasone (FLONASE) 50 MCG/ACT nasal spray Place 2 sprays into both nostrils daily as needed for allergies. 06/12/19   [provider]  glucose blood (ONETOUCH VERIO) test strip Check blood glucose twice daily before meals 07/23/19   Mack Hook, MD  guaiFENesin-codeine 100-10 MG/5ML syrup Take 5 mLs by mouth 3 (three) times daily as needed for cough. 05/14/21   [provider]  Lancets Glory Rosebush ULTRASOFT) lancets Check blood glucose twice daily before meals 07/23/19   Mack Hook, MD  losartan (COZAAR) 25 MG tablet Take 25 mg by mouth daily. 04/17/21   [provider]  metFORMIN (GLUCOPHAGE-XR) 500 MG 24 hr tablet 2 tabs by mouth twice daily with meals Patient taking differently: Take 500 mg by mouth 2 (two) times daily with a  meal. 07/23/19   Mack Hook, MD  metoprolol tartrate (LOPRESSOR) 25 MG tablet Take 25 mg by mouth 2 (two) times daily. 05/28/21   [provider]  pantoprazole (PROTONIX) 40 MG tablet Take 1 tablet (40 mg total) by mouth 2 (two) times daily. 06/01/21 06/01/22  Barb Merino, MD  pioglitazone (ACTOS) 45 MG tablet Take 45 mg by mouth daily. 05/28/21   [provider]  TRULICITY 4.5 0000000 SOPN Inject 0.5 mLs into the skin once a week. 05/08/21   [provider]    Allergies    Penicillins and Lisinopril  Review of Systems   Review of Systems  Constitutional:  Negative for chills and fever.  Eyes:   Positive for photophobia. Negative for visual disturbance.  Respiratory:  Negative for shortness of breath.   Cardiovascular:  Negative for chest pain.  Gastrointestinal:  Negative for abdominal pain, diarrhea, nausea and vomiting.  Endocrine:       + hyperglycemia   Neurological:  Positive for headaches.  All other systems reviewed and are negative.  Physical Exam Updated Vital Signs BP (!) 126/97   Pulse 77   Temp 98.4 F (36.9 C) (Oral)   Resp 16   Ht 5\' 4"  (1.626 m)   Wt 91.6 kg   SpO2 96%   BMI 34.67 kg/m   Physical Exam Vitals and nursing note reviewed.  Constitutional:      Appearance: She is not ill-appearing or diaphoretic.  HENT:     Head: Normocephalic and atraumatic.  Eyes:     Conjunctiva/sclera: Conjunctivae normal.  Cardiovascular:     Rate and Rhythm: Normal rate and regular rhythm.     Pulses: Normal pulses.  Pulmonary:     Effort: Pulmonary effort is normal.     Breath sounds: Normal breath sounds. No wheezing, rhonchi or rales.  Abdominal:     Palpations: Abdomen is soft.     Tenderness: There is no abdominal tenderness.  Musculoskeletal:     Cervical back: Neck supple.  Skin:    General: Skin is warm and dry.  Neurological:     Mental Status: She is alert.    ED Results / Procedures / Treatments   Labs (all labs ordered are listed, but only abnormal results are displayed) Labs Reviewed  CBG MONITORING, ED - Abnormal; Notable for the following components:      Result Value   Glucose-Capillary 267 (*)    All other components within normal limits  BASIC METABOLIC PANEL  CBC WITH DIFFERENTIAL/PLATELET    EKG None  Radiology No results found.  Procedures Procedures   Medications Ordered in ED Medications - No data to display  ED Course  I have reviewed the triage vital signs and the nursing notes.  Pertinent labs & imaging results that were available during my care of the patient were reviewed by me and considered in my medical  decision making (see chart for details).    MDM Rules/Calculators/A&P                           55 year old female presents to the ED today with complaint of hyperglycemia and continued headaches.  She states that she has been out of Trulicity for the past month and her PCP has been unable to give her free samples.  As she currently is without insurance, her new insurance for her job kicks in on the 20th of this month.  On arrival to the ED  today vitals are stable.  Blood pressure 108/73 with repeat 126/97.  BG on arrival 267.  Patient states it has been in the 200s to 300s recently.  She is taking her metformin and Actos.  Denies any nausea, vomiting, shortness of breath.  Does not appear to have [place] respirations.  Is comfortable appearing very low suspicion for DKA.  We will plan to get labs including CBC and BMP.  We will attempt transition of care consult  for medication assistance however patient will ultimately need to follow-up with her PCP for same.  CBC without leukocytosis. Hgb stable at 12.1  NP still pending.  Nursing staff informs me that patient wants to leave.  She received a call from her PCP who advised that they have free samples for patient.  She would like to go pick them up and needs to be there by 4:30 PM today.  I have advised that she follow-up with her PCP regarding prescription medication needs.  Should her insurance does not kick in in a few days and hopefully at that point there will be no issue with affording the medications.  Patient will follow up on her BMP on my chart.  Stable for discharge.   This note was prepared using Dragon voice recognition software and may include unintentional dictation errors due to the inherent limitations of voice recognition software.   Final Clinical Impression(s) / ED Diagnoses Final diagnoses:  None    Rx / DC Orders ED Discharge Orders     None        Discharge Instructions      Please follow up with your PCP  regarding medication needs       Tanda Rockers, PA-C 07/19/21 1541    Pricilla Loveless, MD 07/20/21 [phone]

## 2021-07-19 NOTE — ED Notes (Signed)
Attempted PIV X 1. Another RN to attempt.

## 2021-07-19 NOTE — ED Notes (Signed)
Pt approached nurses station stating she would like to be discharged d/t doctor's office calling and saying they have some medication samples in now. Secure chat sent to PA.

## 2021-07-19 NOTE — Discharge Instructions (Signed)
Please follow up with your PCP regarding medication needs

## 2021-07-19 NOTE — ED Triage Notes (Addendum)
Pt c/o elevated BS x 3 months-states she normally gets med samples from PCP-was told they are out of samples-does not have rx/states she would not be able to afford if she did-NAD-steady gait

## 2021-08-30 ENCOUNTER — Other Ambulatory Visit (HOSPITAL_COMMUNITY): Payer: Self-pay

## 2021-12-02 ENCOUNTER — Other Ambulatory Visit: Payer: Self-pay

## 2021-12-02 ENCOUNTER — Emergency Department (HOSPITAL_BASED_OUTPATIENT_CLINIC_OR_DEPARTMENT_OTHER)
Admission: EM | Admit: 2021-12-02 | Discharge: 2021-12-02 | Disposition: A | Discharge status: Home / Self Care | Payer: Self-pay | Attending: Emergency Medicine | Admitting: Emergency Medicine

## 2021-12-02 ENCOUNTER — Encounter (HOSPITAL_BASED_OUTPATIENT_CLINIC_OR_DEPARTMENT_OTHER): Payer: Self-pay | Admitting: Emergency Medicine

## 2021-12-02 DIAGNOSIS — R5383 Other fatigue: Secondary | ICD-10-CM | POA: Insufficient documentation

## 2021-12-02 DIAGNOSIS — R059 Cough, unspecified: Secondary | ICD-10-CM | POA: Insufficient documentation

## 2021-12-02 DIAGNOSIS — M791 Myalgia, unspecified site: Secondary | ICD-10-CM

## 2021-12-02 DIAGNOSIS — Z20822 Contact with and (suspected) exposure to covid-19: Secondary | ICD-10-CM | POA: Insufficient documentation

## 2021-12-02 DIAGNOSIS — R0981 Nasal congestion: Secondary | ICD-10-CM | POA: Insufficient documentation

## 2021-12-02 LAB — CBC WITH DIFFERENTIAL/PLATELET
Abs Immature Granulocytes: 0.01 10*3/uL (ref 0.00–0.07)
Basophils Absolute: 0 10*3/uL (ref 0.0–0.1)
Basophils Relative: 1 %
Eosinophils Absolute: 0.2 10*3/uL (ref 0.0–0.5)
Eosinophils Relative: 4 %
HCT: 33.7 % — ABNORMAL LOW (ref 36.0–46.0)
Hemoglobin: 11.2 g/dL — ABNORMAL LOW (ref 12.0–15.0)
Immature Granulocytes: 0 %
Lymphocytes Relative: 36 %
Lymphs Abs: 1.9 10*3/uL (ref 0.7–4.0)
MCH: 28 pg (ref 26.0–34.0)
MCHC: 33.2 g/dL (ref 30.0–36.0)
MCV: 84.3 fL (ref 80.0–100.0)
Monocytes Absolute: 0.5 10*3/uL (ref 0.1–1.0)
Monocytes Relative: 10 %
Neutro Abs: 2.7 10*3/uL (ref 1.7–7.7)
Neutrophils Relative %: 49 %
Platelets: 267 10*3/uL (ref 150–400)
RBC: 4 MIL/uL (ref 3.87–5.11)
RDW: 13.5 % (ref 11.5–15.5)
WBC: 5.4 10*3/uL (ref 4.0–10.5)
nRBC: 0 % (ref 0.0–0.2)

## 2021-12-02 LAB — CK: Total CK: 165 U/L (ref 38–234)

## 2021-12-02 LAB — BASIC METABOLIC PANEL
Anion gap: 8 (ref 5–15)
BUN: 10 mg/dL (ref 6–20)
CO2: 25 mmol/L (ref 22–32)
Calcium: 8.8 mg/dL — ABNORMAL LOW (ref 8.9–10.3)
Chloride: 101 mmol/L (ref 98–111)
Creatinine, Ser: 0.62 mg/dL (ref 0.44–1.00)
GFR, Estimated: >60 mL/min (ref >60)
Glucose, Bld: 335 mg/dL — ABNORMAL HIGH (ref 70–99)
Potassium: 3.6 mmol/L (ref 3.5–5.1)
Sodium: 134 mmol/L — ABNORMAL LOW (ref 135–145)

## 2021-12-02 LAB — RESP PANEL BY RT-PCR (FLU A&B, COVID) ARPGX2
Influenza A by PCR: NEGATIVE
Influenza B by PCR: NEGATIVE
SARS Coronavirus 2 by RT PCR: NEGATIVE

## 2021-12-02 LAB — GROUP A STREP BY PCR: Group A Strep by PCR: NOT DETECTED

## 2021-12-02 MED ORDER — KETOROLAC TROMETHAMINE 30 MG/ML IJ SOLN
15.0000 mg | Freq: Once | INTRAMUSCULAR | Status: AC
  Administered 2021-12-02: 15 mg via INTRAVENOUS
  Filled 2021-12-02: qty 1

## 2021-12-02 MED ORDER — LACTATED RINGERS IV BOLUS
1000.0000 mL | Freq: Once | INTRAVENOUS | Status: AC
  Administered 2021-12-02: 1000 mL via INTRAVENOUS

## 2021-12-02 NOTE — ED Provider Notes (Signed)
?[facility] EMERGENCY DEPARTMENT ?Provider Note ? ? ?CSN: [id number] ?Arrival date & time: 12/02/21  0416 ? ?  ? ?History ? ?Chief Complaint  ?Patient presents with  ? URI  ? ? ?Kiara Hobbs is a 56 y.o. female. ? ? ?URI ?Presenting symptoms: congestion, cough, fatigue and rhinorrhea   ?Presenting symptoms: no fever   ?Severity:  Mild ?Onset quality:  Gradual ?Chronicity:  New ?Relieved by:  None tried ? ?  ? ?Home Medications ?Prior to Admission medications   ?Medication Sig Start Date End Date Taking? Authorizing Provider  ?atorvastatin (LIPITOR) 40 MG tablet Take 40 mg by mouth daily. 09/12/18   [provider]  ?fenofibrate 160 MG tablet Take 160 mg by mouth daily. 10/14/18   [provider]  ?fluticasone (FLONASE) 50 MCG/ACT nasal spray Place 2 sprays into both nostrils daily as needed for allergies. 06/12/19   [provider]  ?glucose blood (ONETOUCH VERIO) test strip Check blood glucose twice daily before meals 07/23/19   Julieanne Manson, MD  ?guaiFENesin-codeine 100-10 MG/5ML syrup Take 5 mLs by mouth 3 (three) times daily as needed for cough. 05/14/21   [provider]  ?Lancets Letta Pate ULTRASOFT) lancets Check blood glucose twice daily before meals 07/23/19   Julieanne Manson, MD  ?losartan (COZAAR) 25 MG tablet Take 25 mg by mouth daily. 04/17/21   [provider]  ?metFORMIN (GLUCOPHAGE-XR) 500 MG 24 hr tablet 2 tabs by mouth twice daily with meals ?Patient taking differently: Take 500 mg by mouth 2 (two) times daily with a meal. 07/23/19   Julieanne Manson, MD  ?metoprolol tartrate (LOPRESSOR) 25 MG tablet Take 25 mg by mouth 2 (two) times daily. 05/28/21   [provider]  ?pantoprazole (PROTONIX) 40 MG tablet Take 1 tablet (40 mg total) by mouth 2 (two) times daily. 06/01/21 06/01/22  Dorcas Carrow, MD  ?pioglitazone (ACTOS) 45 MG tablet Take 45 mg by mouth daily. 05/28/21   [provider]  ?TRULICITY 4.5 MG/0.5ML SOPN Inject  0.5 mLs into the skin once a week. 05/08/21   [provider]  ?   ? ?Allergies    ?Penicillins and Lisinopril   ? ?Review of Systems   ?Review of Systems  ?Constitutional:  Positive for fatigue. Negative for fever.  ?HENT:  Positive for congestion and rhinorrhea.   ?Respiratory:  Positive for cough.   ? ?Physical Exam ?Updated Vital Signs ?BP (!) 159/82   Pulse 83   Temp 98.1 ?F (36.7 ?C) (Oral)   Resp 18   Ht 5\' 4"  (1.626 m)   Wt 81.6 kg   SpO2 97%   BMI 30.90 kg/m?  ?Physical Exam ?Vitals and nursing note reviewed.  ?Constitutional:   ?   Appearance: She is well-developed.  ?HENT:  ?   Head: Normocephalic and atraumatic.  ?Eyes:  ?   Pupils: Pupils are equal, round, and reactive to light.  ?Cardiovascular:  ?   Rate and Rhythm: Normal rate and regular rhythm.  ?Pulmonary:  ?   Effort: Pulmonary effort is normal. No respiratory distress.  ?   Breath sounds: No stridor.  ?Abdominal:  ?   General: Abdomen is flat. There is no distension.  ?Musculoskeletal:  ?   Cervical back: Normal range of motion.  ?Neurological:  ?   General: No focal deficit present.  ?   Mental Status: She is alert.  ? ? ?ED Results / Procedures / Treatments   ?Labs ?(all labs ordered are listed, but only abnormal results  are displayed) ?Labs Reviewed  ?CBC WITH DIFFERENTIAL/PLATELET - Abnormal; Notable for the following components:  ?    Result Value  ? Hemoglobin 11.2 (*)   ? HCT 33.7 (*)   ? All other components within normal limits  ?BASIC METABOLIC PANEL - Abnormal; Notable for the following components:  ? Sodium 134 (*)   ? Glucose, Bld 335 (*)   ? Calcium 8.8 (*)   ? All other components within normal limits  ?RESP PANEL BY RT-PCR (FLU A&B, COVID) ARPGX2  ?GROUP A STREP BY PCR  ?CK  ? ? ?EKG ?None ? ?Radiology ?No results found. ? ?Procedures ?Procedures  ? ? ?Medications Ordered in ED ?Medications  ?lactated ringers bolus 1,000 mL (1,000 mLs Intravenous New Bag/Given 12/02/21 0618)  ?ketorolac (TORADOL) 30 MG/ML injection  15 mg (15 mg Intravenous Given 12/02/21 0634)  ? ? ?ED Course/ Medical Decision Making/ A&P ?  ?                        ?Medical Decision Making ?Amount and/or Complexity of Data Reviewed ?Labs: ordered. ? ?Risk ?Prescription drug management. ? ? ?Workup reassuring. Likely viral URI. Symptoms improved with medications here. Possibly a component of mild dehydration? No indciation for admission or further workup at this time.  ? ? ?Final Clinical Impression(s) / ED Diagnoses ?Final diagnoses:  ?Myalgia  ? ? ?Rx / DC Orders ?ED Discharge Orders   ? ? None  ? ?  ? ? ?  ?Marily Memos, MD ?12/04/21 [phone] ? ?

## 2021-12-02 NOTE — ED Triage Notes (Addendum)
Pt c/o congestion, body aches, diarrhea and chills ?

## 2021-12-17 DIAGNOSIS — E785 Hyperlipidemia, unspecified: Secondary | ICD-10-CM | POA: Diagnosis not present

## 2021-12-17 DIAGNOSIS — I10 Essential (primary) hypertension: Secondary | ICD-10-CM | POA: Diagnosis not present

## 2021-12-17 DIAGNOSIS — E559 Vitamin D deficiency, unspecified: Secondary | ICD-10-CM | POA: Diagnosis not present

## 2021-12-17 DIAGNOSIS — E119 Type 2 diabetes mellitus without complications: Secondary | ICD-10-CM | POA: Diagnosis not present

## 2021-12-19 DIAGNOSIS — G629 Polyneuropathy, unspecified: Secondary | ICD-10-CM | POA: Diagnosis not present

## 2021-12-21 DIAGNOSIS — I700 Atherosclerosis of aorta: Secondary | ICD-10-CM | POA: Diagnosis not present

## 2021-12-21 DIAGNOSIS — M5416 Radiculopathy, lumbar region: Secondary | ICD-10-CM | POA: Diagnosis not present

## 2021-12-21 DIAGNOSIS — E785 Hyperlipidemia, unspecified: Secondary | ICD-10-CM | POA: Diagnosis not present

## 2021-12-21 DIAGNOSIS — I10 Essential (primary) hypertension: Secondary | ICD-10-CM | POA: Diagnosis not present

## 2021-12-21 DIAGNOSIS — M79642 Pain in left hand: Secondary | ICD-10-CM | POA: Diagnosis not present

## 2021-12-21 DIAGNOSIS — M79641 Pain in right hand: Secondary | ICD-10-CM | POA: Diagnosis not present

## 2021-12-21 DIAGNOSIS — M5412 Radiculopathy, cervical region: Secondary | ICD-10-CM | POA: Diagnosis not present

## 2021-12-21 DIAGNOSIS — J300 Vasomotor rhinitis: Secondary | ICD-10-CM | POA: Diagnosis not present

## 2021-12-21 DIAGNOSIS — E781 Pure hyperglyceridemia: Secondary | ICD-10-CM | POA: Diagnosis not present

## 2021-12-21 DIAGNOSIS — K219 Gastro-esophageal reflux disease without esophagitis: Secondary | ICD-10-CM | POA: Diagnosis not present

## 2022-10-27 DIAGNOSIS — I10 Essential (primary) hypertension: Secondary | ICD-10-CM | POA: Diagnosis not present

## 2022-10-27 DIAGNOSIS — M25512 Pain in left shoulder: Secondary | ICD-10-CM | POA: Diagnosis not present

## 2022-10-27 DIAGNOSIS — M25511 Pain in right shoulder: Secondary | ICD-10-CM | POA: Diagnosis not present

## 2022-10-27 DIAGNOSIS — I700 Atherosclerosis of aorta: Secondary | ICD-10-CM | POA: Diagnosis not present

## 2022-10-27 DIAGNOSIS — M67814 Other specified disorders of tendon, left shoulder: Secondary | ICD-10-CM | POA: Diagnosis not present

## 2022-10-27 DIAGNOSIS — Z1231 Encounter for screening mammogram for malignant neoplasm of breast: Secondary | ICD-10-CM | POA: Diagnosis not present

## 2022-11-01 DIAGNOSIS — M25561 Pain in right knee: Secondary | ICD-10-CM | POA: Diagnosis not present

## 2022-11-01 DIAGNOSIS — S80211A Abrasion, right knee, initial encounter: Secondary | ICD-10-CM | POA: Diagnosis not present

## 2022-11-01 DIAGNOSIS — S80212A Abrasion, left knee, initial encounter: Secondary | ICD-10-CM | POA: Diagnosis not present

## 2022-11-01 DIAGNOSIS — W19XXXA Unspecified fall, initial encounter: Secondary | ICD-10-CM | POA: Diagnosis not present

## 2022-11-01 DIAGNOSIS — M25562 Pain in left knee: Secondary | ICD-10-CM | POA: Diagnosis not present

## 2022-11-01 DIAGNOSIS — W1830XA Fall on same level, unspecified, initial encounter: Secondary | ICD-10-CM | POA: Diagnosis not present

## 2022-11-01 DIAGNOSIS — M25511 Pain in right shoulder: Secondary | ICD-10-CM | POA: Diagnosis not present

## 2022-11-07 DIAGNOSIS — M25511 Pain in right shoulder: Secondary | ICD-10-CM | POA: Diagnosis not present

## 2022-11-24 DIAGNOSIS — Z20822 Contact with and (suspected) exposure to covid-19: Secondary | ICD-10-CM | POA: Diagnosis not present

## 2022-11-24 DIAGNOSIS — R0981 Nasal congestion: Secondary | ICD-10-CM | POA: Diagnosis not present

## 2022-11-24 DIAGNOSIS — R051 Acute cough: Secondary | ICD-10-CM | POA: Diagnosis not present

## 2022-11-24 DIAGNOSIS — J029 Acute pharyngitis, unspecified: Secondary | ICD-10-CM | POA: Diagnosis not present

## 2022-12-09 DIAGNOSIS — S42254D Nondisplaced fracture of greater tuberosity of right humerus, subsequent encounter for fracture with routine healing: Secondary | ICD-10-CM | POA: Diagnosis not present

## 2022-12-15 DIAGNOSIS — M7521 Bicipital tendinitis, right shoulder: Secondary | ICD-10-CM | POA: Diagnosis not present

## 2022-12-15 DIAGNOSIS — M75101 Unspecified rotator cuff tear or rupture of right shoulder, not specified as traumatic: Secondary | ICD-10-CM | POA: Diagnosis not present

## 2023-01-23 DIAGNOSIS — M5442 Lumbago with sciatica, left side: Secondary | ICD-10-CM | POA: Diagnosis not present

## 2023-01-23 DIAGNOSIS — M5441 Lumbago with sciatica, right side: Secondary | ICD-10-CM | POA: Diagnosis not present

## 2023-01-27 DIAGNOSIS — J069 Acute upper respiratory infection, unspecified: Secondary | ICD-10-CM | POA: Diagnosis not present

## 2023-01-27 DIAGNOSIS — Z20822 Contact with and (suspected) exposure to covid-19: Secondary | ICD-10-CM | POA: Diagnosis not present

## 2023-01-27 DIAGNOSIS — J40 Bronchitis, not specified as acute or chronic: Secondary | ICD-10-CM | POA: Diagnosis not present

## 2023-01-27 DIAGNOSIS — J029 Acute pharyngitis, unspecified: Secondary | ICD-10-CM | POA: Diagnosis not present

## 2023-02-15 DIAGNOSIS — M545 Low back pain, unspecified: Secondary | ICD-10-CM | POA: Diagnosis not present

## 2023-02-16 DIAGNOSIS — K219 Gastro-esophageal reflux disease without esophagitis: Secondary | ICD-10-CM | POA: Diagnosis not present

## 2023-02-16 DIAGNOSIS — R7989 Other specified abnormal findings of blood chemistry: Secondary | ICD-10-CM | POA: Diagnosis not present

## 2023-02-16 DIAGNOSIS — G5711 Meralgia paresthetica, right lower limb: Secondary | ICD-10-CM | POA: Diagnosis not present

## 2023-02-16 DIAGNOSIS — R748 Abnormal levels of other serum enzymes: Secondary | ICD-10-CM | POA: Diagnosis not present

## 2023-02-16 DIAGNOSIS — Z1159 Encounter for screening for other viral diseases: Secondary | ICD-10-CM | POA: Diagnosis not present

## 2023-02-16 DIAGNOSIS — E1136 Type 2 diabetes mellitus with diabetic cataract: Secondary | ICD-10-CM | POA: Diagnosis not present

## 2023-02-16 DIAGNOSIS — E785 Hyperlipidemia, unspecified: Secondary | ICD-10-CM | POA: Diagnosis not present

## 2023-02-16 DIAGNOSIS — R002 Palpitations: Secondary | ICD-10-CM | POA: Diagnosis not present

## 2023-02-16 DIAGNOSIS — G8929 Other chronic pain: Secondary | ICD-10-CM | POA: Diagnosis not present

## 2023-02-16 DIAGNOSIS — E669 Obesity, unspecified: Secondary | ICD-10-CM | POA: Diagnosis not present

## 2023-02-16 DIAGNOSIS — M545 Low back pain, unspecified: Secondary | ICD-10-CM | POA: Diagnosis not present

## 2023-02-16 DIAGNOSIS — Z794 Long term (current) use of insulin: Secondary | ICD-10-CM | POA: Diagnosis not present

## 2023-02-16 DIAGNOSIS — I10 Essential (primary) hypertension: Secondary | ICD-10-CM | POA: Diagnosis not present

## 2023-02-19 DIAGNOSIS — R002 Palpitations: Secondary | ICD-10-CM | POA: Diagnosis not present

## 2023-02-22 DIAGNOSIS — S29011A Strain of muscle and tendon of front wall of thorax, initial encounter: Secondary | ICD-10-CM | POA: Diagnosis not present

## 2023-02-22 DIAGNOSIS — X58XXXA Exposure to other specified factors, initial encounter: Secondary | ICD-10-CM | POA: Diagnosis not present

## 2023-02-22 DIAGNOSIS — Z79899 Other long term (current) drug therapy: Secondary | ICD-10-CM | POA: Diagnosis not present

## 2023-02-22 DIAGNOSIS — S29012A Strain of muscle and tendon of back wall of thorax, initial encounter: Secondary | ICD-10-CM | POA: Diagnosis not present

## 2023-02-22 DIAGNOSIS — R0602 Shortness of breath: Secondary | ICD-10-CM | POA: Diagnosis not present

## 2023-02-22 DIAGNOSIS — R911 Solitary pulmonary nodule: Secondary | ICD-10-CM | POA: Diagnosis not present

## 2023-02-22 DIAGNOSIS — R058 Other specified cough: Secondary | ICD-10-CM | POA: Diagnosis not present

## 2023-02-22 DIAGNOSIS — J209 Acute bronchitis, unspecified: Secondary | ICD-10-CM | POA: Diagnosis not present

## 2023-02-22 DIAGNOSIS — R002 Palpitations: Secondary | ICD-10-CM | POA: Diagnosis not present

## 2023-02-22 DIAGNOSIS — R079 Chest pain, unspecified: Secondary | ICD-10-CM | POA: Diagnosis not present

## 2023-02-22 DIAGNOSIS — I700 Atherosclerosis of aorta: Secondary | ICD-10-CM | POA: Diagnosis not present

## 2023-02-24 DIAGNOSIS — R7989 Other specified abnormal findings of blood chemistry: Secondary | ICD-10-CM | POA: Diagnosis not present

## 2023-02-24 DIAGNOSIS — I10 Essential (primary) hypertension: Secondary | ICD-10-CM | POA: Diagnosis not present

## 2023-03-03 DIAGNOSIS — I471 Supraventricular tachycardia, unspecified: Secondary | ICD-10-CM | POA: Diagnosis not present

## 2023-03-07 DIAGNOSIS — M5416 Radiculopathy, lumbar region: Secondary | ICD-10-CM | POA: Diagnosis not present

## 2023-03-07 DIAGNOSIS — G5711 Meralgia paresthetica, right lower limb: Secondary | ICD-10-CM | POA: Diagnosis not present

## 2023-03-21 DIAGNOSIS — R002 Palpitations: Secondary | ICD-10-CM | POA: Diagnosis not present

## 2023-03-28 DIAGNOSIS — I471 Supraventricular tachycardia, unspecified: Secondary | ICD-10-CM | POA: Diagnosis not present

## 2023-03-29 DIAGNOSIS — M48061 Spinal stenosis, lumbar region without neurogenic claudication: Secondary | ICD-10-CM | POA: Diagnosis not present

## 2023-03-29 DIAGNOSIS — M5416 Radiculopathy, lumbar region: Secondary | ICD-10-CM | POA: Diagnosis not present

## 2023-03-29 DIAGNOSIS — M5117 Intervertebral disc disorders with radiculopathy, lumbosacral region: Secondary | ICD-10-CM | POA: Diagnosis not present

## 2023-03-29 DIAGNOSIS — M5116 Intervertebral disc disorders with radiculopathy, lumbar region: Secondary | ICD-10-CM | POA: Diagnosis not present

## 2023-03-29 DIAGNOSIS — M545 Low back pain, unspecified: Secondary | ICD-10-CM | POA: Diagnosis not present

## 2023-04-05 DIAGNOSIS — M5441 Lumbago with sciatica, right side: Secondary | ICD-10-CM | POA: Diagnosis not present

## 2023-04-05 DIAGNOSIS — G8929 Other chronic pain: Secondary | ICD-10-CM | POA: Diagnosis not present

## 2023-04-28 DIAGNOSIS — Z20822 Contact with and (suspected) exposure to covid-19: Secondary | ICD-10-CM | POA: Diagnosis not present

## 2023-04-28 DIAGNOSIS — J029 Acute pharyngitis, unspecified: Secondary | ICD-10-CM | POA: Diagnosis not present

## 2023-04-28 DIAGNOSIS — R059 Cough, unspecified: Secondary | ICD-10-CM | POA: Diagnosis not present

## 2023-04-28 DIAGNOSIS — J40 Bronchitis, not specified as acute or chronic: Secondary | ICD-10-CM | POA: Diagnosis not present

## 2023-04-28 DIAGNOSIS — J329 Chronic sinusitis, unspecified: Secondary | ICD-10-CM | POA: Diagnosis not present

## 2023-05-03 ENCOUNTER — Other Ambulatory Visit (HOSPITAL_BASED_OUTPATIENT_CLINIC_OR_DEPARTMENT_OTHER): Payer: Self-pay

## 2023-05-03 DIAGNOSIS — I10 Essential (primary) hypertension: Secondary | ICD-10-CM | POA: Diagnosis not present

## 2023-05-03 DIAGNOSIS — E785 Hyperlipidemia, unspecified: Secondary | ICD-10-CM | POA: Diagnosis not present

## 2023-05-03 DIAGNOSIS — Z794 Long term (current) use of insulin: Secondary | ICD-10-CM | POA: Diagnosis not present

## 2023-05-03 DIAGNOSIS — E1165 Type 2 diabetes mellitus with hyperglycemia: Secondary | ICD-10-CM | POA: Diagnosis not present

## 2023-05-03 MED ORDER — MOUNJARO 5 MG/0.5ML ~~LOC~~ SOAJ
5.0000 mg | SUBCUTANEOUS | 3 refills | Status: AC
Start: 2023-05-03 — End: ?
  Filled 2023-05-03 – 2024-01-23 (×5): qty 2, 28d supply, fill #0

## 2023-05-04 ENCOUNTER — Other Ambulatory Visit (HOSPITAL_BASED_OUTPATIENT_CLINIC_OR_DEPARTMENT_OTHER): Payer: Self-pay

## 2023-05-05 ENCOUNTER — Other Ambulatory Visit (HOSPITAL_BASED_OUTPATIENT_CLINIC_OR_DEPARTMENT_OTHER): Payer: Self-pay

## 2023-05-08 ENCOUNTER — Other Ambulatory Visit (HOSPITAL_BASED_OUTPATIENT_CLINIC_OR_DEPARTMENT_OTHER): Payer: Self-pay

## 2023-05-09 ENCOUNTER — Other Ambulatory Visit (HOSPITAL_BASED_OUTPATIENT_CLINIC_OR_DEPARTMENT_OTHER): Payer: Self-pay

## 2023-05-10 ENCOUNTER — Other Ambulatory Visit (HOSPITAL_BASED_OUTPATIENT_CLINIC_OR_DEPARTMENT_OTHER): Payer: Self-pay

## 2023-05-12 ENCOUNTER — Other Ambulatory Visit (HOSPITAL_BASED_OUTPATIENT_CLINIC_OR_DEPARTMENT_OTHER): Payer: Self-pay

## 2023-05-16 ENCOUNTER — Other Ambulatory Visit (HOSPITAL_BASED_OUTPATIENT_CLINIC_OR_DEPARTMENT_OTHER): Payer: Self-pay

## 2023-05-17 ENCOUNTER — Other Ambulatory Visit (HOSPITAL_BASED_OUTPATIENT_CLINIC_OR_DEPARTMENT_OTHER): Payer: Self-pay

## 2023-05-19 ENCOUNTER — Other Ambulatory Visit (HOSPITAL_BASED_OUTPATIENT_CLINIC_OR_DEPARTMENT_OTHER): Payer: Self-pay

## 2023-05-23 ENCOUNTER — Other Ambulatory Visit (HOSPITAL_BASED_OUTPATIENT_CLINIC_OR_DEPARTMENT_OTHER): Payer: Self-pay

## 2023-05-23 DIAGNOSIS — I10 Essential (primary) hypertension: Secondary | ICD-10-CM | POA: Diagnosis not present

## 2023-05-23 DIAGNOSIS — E669 Obesity, unspecified: Secondary | ICD-10-CM | POA: Diagnosis not present

## 2023-05-23 DIAGNOSIS — Z23 Encounter for immunization: Secondary | ICD-10-CM | POA: Diagnosis not present

## 2023-05-23 DIAGNOSIS — E785 Hyperlipidemia, unspecified: Secondary | ICD-10-CM | POA: Diagnosis not present

## 2023-05-23 DIAGNOSIS — H8113 Benign paroxysmal vertigo, bilateral: Secondary | ICD-10-CM | POA: Diagnosis not present

## 2023-05-23 MED ORDER — MOUNJARO 5 MG/0.5ML ~~LOC~~ SOAJ
5.0000 mg | SUBCUTANEOUS | 3 refills | Status: DC
Start: 2023-05-23 — End: 2023-09-21
  Filled 2023-05-23 – 2023-07-06 (×4): qty 2, 28d supply, fill #0
  Filled 2023-07-24 – 2023-07-27 (×2): qty 2, 28d supply, fill #1
  Filled 2023-08-29: qty 2, 28d supply, fill #2

## 2023-05-24 ENCOUNTER — Other Ambulatory Visit (HOSPITAL_BASED_OUTPATIENT_CLINIC_OR_DEPARTMENT_OTHER): Payer: Self-pay

## 2023-05-24 DIAGNOSIS — B349 Viral infection, unspecified: Secondary | ICD-10-CM | POA: Diagnosis not present

## 2023-05-24 DIAGNOSIS — Z20822 Contact with and (suspected) exposure to covid-19: Secondary | ICD-10-CM | POA: Diagnosis not present

## 2023-05-25 ENCOUNTER — Other Ambulatory Visit (HOSPITAL_BASED_OUTPATIENT_CLINIC_OR_DEPARTMENT_OTHER): Payer: Self-pay

## 2023-05-26 ENCOUNTER — Other Ambulatory Visit (HOSPITAL_BASED_OUTPATIENT_CLINIC_OR_DEPARTMENT_OTHER): Payer: Self-pay

## 2023-05-29 ENCOUNTER — Other Ambulatory Visit (HOSPITAL_BASED_OUTPATIENT_CLINIC_OR_DEPARTMENT_OTHER): Payer: Self-pay

## 2023-05-30 ENCOUNTER — Other Ambulatory Visit (HOSPITAL_BASED_OUTPATIENT_CLINIC_OR_DEPARTMENT_OTHER): Payer: Self-pay

## 2023-05-31 ENCOUNTER — Other Ambulatory Visit (HOSPITAL_BASED_OUTPATIENT_CLINIC_OR_DEPARTMENT_OTHER): Payer: Self-pay

## 2023-06-19 DIAGNOSIS — M5442 Lumbago with sciatica, left side: Secondary | ICD-10-CM | POA: Diagnosis not present

## 2023-06-19 DIAGNOSIS — R82998 Other abnormal findings in urine: Secondary | ICD-10-CM | POA: Diagnosis not present

## 2023-06-30 ENCOUNTER — Other Ambulatory Visit (HOSPITAL_COMMUNITY): Payer: Self-pay

## 2023-07-03 ENCOUNTER — Other Ambulatory Visit (HOSPITAL_BASED_OUTPATIENT_CLINIC_OR_DEPARTMENT_OTHER): Payer: Self-pay

## 2023-07-04 ENCOUNTER — Other Ambulatory Visit (HOSPITAL_BASED_OUTPATIENT_CLINIC_OR_DEPARTMENT_OTHER): Payer: Self-pay

## 2023-07-05 ENCOUNTER — Other Ambulatory Visit (HOSPITAL_BASED_OUTPATIENT_CLINIC_OR_DEPARTMENT_OTHER): Payer: Self-pay

## 2023-07-06 ENCOUNTER — Other Ambulatory Visit (HOSPITAL_BASED_OUTPATIENT_CLINIC_OR_DEPARTMENT_OTHER): Payer: Self-pay

## 2023-07-06 DIAGNOSIS — Z5181 Encounter for therapeutic drug level monitoring: Secondary | ICD-10-CM | POA: Diagnosis not present

## 2023-07-18 ENCOUNTER — Emergency Department (HOSPITAL_COMMUNITY): Admission: EM | Admit: 2023-07-18 | Discharge: 2023-07-18 | Discharge status: Against Medical Advice | Payer: 59

## 2023-07-18 ENCOUNTER — Other Ambulatory Visit: Payer: Self-pay

## 2023-07-18 NOTE — ED Notes (Signed)
Stated that she is going to leave

## 2023-07-24 ENCOUNTER — Other Ambulatory Visit (HOSPITAL_BASED_OUTPATIENT_CLINIC_OR_DEPARTMENT_OTHER): Payer: Self-pay

## 2023-07-27 ENCOUNTER — Other Ambulatory Visit (HOSPITAL_COMMUNITY): Payer: Self-pay

## 2023-08-03 ENCOUNTER — Other Ambulatory Visit (HOSPITAL_BASED_OUTPATIENT_CLINIC_OR_DEPARTMENT_OTHER): Payer: Self-pay

## 2023-08-23 DIAGNOSIS — U071 COVID-19: Secondary | ICD-10-CM | POA: Diagnosis not present

## 2023-08-29 ENCOUNTER — Other Ambulatory Visit: Payer: Self-pay

## 2023-08-29 ENCOUNTER — Other Ambulatory Visit (HOSPITAL_BASED_OUTPATIENT_CLINIC_OR_DEPARTMENT_OTHER): Payer: Self-pay

## 2023-09-21 ENCOUNTER — Other Ambulatory Visit (HOSPITAL_BASED_OUTPATIENT_CLINIC_OR_DEPARTMENT_OTHER): Payer: Self-pay

## 2023-09-21 DIAGNOSIS — E119 Type 2 diabetes mellitus without complications: Secondary | ICD-10-CM | POA: Diagnosis not present

## 2023-09-21 MED ORDER — MOUNJARO 7.5 MG/0.5ML ~~LOC~~ SOAJ
7.5000 mg | SUBCUTANEOUS | 3 refills | Status: DC
  Filled 2023-09-21: qty 2, 28d supply, fill #0
  Filled 2023-10-11 – 2023-10-12 (×2): qty 2, 28d supply, fill #1
  Filled 2023-11-14: qty 2, 28d supply, fill #2
  Filled 2023-12-07 – 2023-12-21 (×4): qty 2, 28d supply, fill #3

## 2023-09-21 MED ORDER — ONETOUCH VERIO VI STRP
ORAL_STRIP | 11 refills | Status: DC
  Filled 2023-09-21: qty 100, 33d supply, fill #0

## 2023-10-11 ENCOUNTER — Other Ambulatory Visit (HOSPITAL_BASED_OUTPATIENT_CLINIC_OR_DEPARTMENT_OTHER): Payer: Self-pay

## 2023-10-13 ENCOUNTER — Other Ambulatory Visit (HOSPITAL_BASED_OUTPATIENT_CLINIC_OR_DEPARTMENT_OTHER): Payer: Self-pay

## 2023-10-17 ENCOUNTER — Other Ambulatory Visit (HOSPITAL_BASED_OUTPATIENT_CLINIC_OR_DEPARTMENT_OTHER): Payer: Self-pay

## 2023-10-20 ENCOUNTER — Other Ambulatory Visit (HOSPITAL_BASED_OUTPATIENT_CLINIC_OR_DEPARTMENT_OTHER): Payer: Self-pay

## 2023-10-20 DIAGNOSIS — L299 Pruritus, unspecified: Secondary | ICD-10-CM | POA: Diagnosis not present

## 2023-11-14 ENCOUNTER — Other Ambulatory Visit (HOSPITAL_BASED_OUTPATIENT_CLINIC_OR_DEPARTMENT_OTHER): Payer: Self-pay

## 2023-11-16 DIAGNOSIS — R202 Paresthesia of skin: Secondary | ICD-10-CM | POA: Diagnosis not present

## 2023-11-16 DIAGNOSIS — R200 Anesthesia of skin: Secondary | ICD-10-CM | POA: Diagnosis not present

## 2023-12-04 DIAGNOSIS — R519 Headache, unspecified: Secondary | ICD-10-CM | POA: Diagnosis not present

## 2023-12-04 DIAGNOSIS — J069 Acute upper respiratory infection, unspecified: Secondary | ICD-10-CM | POA: Diagnosis not present

## 2023-12-04 DIAGNOSIS — H9201 Otalgia, right ear: Secondary | ICD-10-CM | POA: Diagnosis not present

## 2023-12-04 DIAGNOSIS — R059 Cough, unspecified: Secondary | ICD-10-CM | POA: Diagnosis not present

## 2023-12-07 ENCOUNTER — Other Ambulatory Visit (HOSPITAL_BASED_OUTPATIENT_CLINIC_OR_DEPARTMENT_OTHER): Payer: Self-pay

## 2023-12-14 ENCOUNTER — Other Ambulatory Visit (HOSPITAL_BASED_OUTPATIENT_CLINIC_OR_DEPARTMENT_OTHER): Payer: Self-pay

## 2023-12-15 ENCOUNTER — Other Ambulatory Visit (HOSPITAL_BASED_OUTPATIENT_CLINIC_OR_DEPARTMENT_OTHER): Payer: Self-pay

## 2023-12-18 ENCOUNTER — Other Ambulatory Visit (HOSPITAL_BASED_OUTPATIENT_CLINIC_OR_DEPARTMENT_OTHER): Payer: Self-pay

## 2023-12-18 DIAGNOSIS — E669 Obesity, unspecified: Secondary | ICD-10-CM | POA: Diagnosis not present

## 2023-12-18 DIAGNOSIS — E785 Hyperlipidemia, unspecified: Secondary | ICD-10-CM | POA: Diagnosis not present

## 2023-12-18 DIAGNOSIS — L659 Nonscarring hair loss, unspecified: Secondary | ICD-10-CM | POA: Diagnosis not present

## 2023-12-18 DIAGNOSIS — E1136 Type 2 diabetes mellitus with diabetic cataract: Secondary | ICD-10-CM | POA: Diagnosis not present

## 2023-12-18 DIAGNOSIS — Z794 Long term (current) use of insulin: Secondary | ICD-10-CM | POA: Diagnosis not present

## 2023-12-18 DIAGNOSIS — K219 Gastro-esophageal reflux disease without esophagitis: Secondary | ICD-10-CM | POA: Diagnosis not present

## 2023-12-18 DIAGNOSIS — I10 Essential (primary) hypertension: Secondary | ICD-10-CM | POA: Diagnosis not present

## 2023-12-19 ENCOUNTER — Other Ambulatory Visit (HOSPITAL_BASED_OUTPATIENT_CLINIC_OR_DEPARTMENT_OTHER): Payer: Self-pay

## 2023-12-20 ENCOUNTER — Other Ambulatory Visit (HOSPITAL_COMMUNITY): Payer: Self-pay

## 2023-12-20 ENCOUNTER — Other Ambulatory Visit (HOSPITAL_BASED_OUTPATIENT_CLINIC_OR_DEPARTMENT_OTHER): Payer: Self-pay

## 2023-12-21 ENCOUNTER — Other Ambulatory Visit (HOSPITAL_BASED_OUTPATIENT_CLINIC_OR_DEPARTMENT_OTHER): Payer: Self-pay

## 2023-12-22 ENCOUNTER — Other Ambulatory Visit (HOSPITAL_BASED_OUTPATIENT_CLINIC_OR_DEPARTMENT_OTHER): Payer: Self-pay

## 2023-12-25 ENCOUNTER — Other Ambulatory Visit (HOSPITAL_BASED_OUTPATIENT_CLINIC_OR_DEPARTMENT_OTHER): Payer: Self-pay

## 2023-12-26 ENCOUNTER — Other Ambulatory Visit (HOSPITAL_BASED_OUTPATIENT_CLINIC_OR_DEPARTMENT_OTHER): Payer: Self-pay

## 2023-12-27 ENCOUNTER — Other Ambulatory Visit (HOSPITAL_BASED_OUTPATIENT_CLINIC_OR_DEPARTMENT_OTHER): Payer: Self-pay

## 2023-12-28 ENCOUNTER — Other Ambulatory Visit (HOSPITAL_BASED_OUTPATIENT_CLINIC_OR_DEPARTMENT_OTHER): Payer: Self-pay

## 2023-12-29 ENCOUNTER — Other Ambulatory Visit (HOSPITAL_BASED_OUTPATIENT_CLINIC_OR_DEPARTMENT_OTHER): Payer: Self-pay

## 2023-12-31 DIAGNOSIS — R0981 Nasal congestion: Secondary | ICD-10-CM | POA: Diagnosis not present

## 2023-12-31 DIAGNOSIS — H9203 Otalgia, bilateral: Secondary | ICD-10-CM | POA: Diagnosis not present

## 2023-12-31 DIAGNOSIS — B349 Viral infection, unspecified: Secondary | ICD-10-CM | POA: Diagnosis not present

## 2023-12-31 DIAGNOSIS — R051 Acute cough: Secondary | ICD-10-CM | POA: Diagnosis not present

## 2023-12-31 DIAGNOSIS — Z20822 Contact with and (suspected) exposure to covid-19: Secondary | ICD-10-CM | POA: Diagnosis not present

## 2023-12-31 DIAGNOSIS — J029 Acute pharyngitis, unspecified: Secondary | ICD-10-CM | POA: Diagnosis not present

## 2024-01-01 ENCOUNTER — Other Ambulatory Visit (HOSPITAL_BASED_OUTPATIENT_CLINIC_OR_DEPARTMENT_OTHER): Payer: Self-pay

## 2024-01-01 DIAGNOSIS — Z794 Long term (current) use of insulin: Secondary | ICD-10-CM | POA: Diagnosis not present

## 2024-01-01 DIAGNOSIS — L659 Nonscarring hair loss, unspecified: Secondary | ICD-10-CM | POA: Diagnosis not present

## 2024-01-01 DIAGNOSIS — E785 Hyperlipidemia, unspecified: Secondary | ICD-10-CM | POA: Diagnosis not present

## 2024-01-01 DIAGNOSIS — E1165 Type 2 diabetes mellitus with hyperglycemia: Secondary | ICD-10-CM | POA: Diagnosis not present

## 2024-01-01 DIAGNOSIS — I10 Essential (primary) hypertension: Secondary | ICD-10-CM | POA: Diagnosis not present

## 2024-01-02 ENCOUNTER — Other Ambulatory Visit (HOSPITAL_BASED_OUTPATIENT_CLINIC_OR_DEPARTMENT_OTHER): Payer: Self-pay

## 2024-01-04 ENCOUNTER — Other Ambulatory Visit (HOSPITAL_BASED_OUTPATIENT_CLINIC_OR_DEPARTMENT_OTHER): Payer: Self-pay

## 2024-01-23 ENCOUNTER — Other Ambulatory Visit (HOSPITAL_BASED_OUTPATIENT_CLINIC_OR_DEPARTMENT_OTHER): Payer: Self-pay

## 2024-01-23 ENCOUNTER — Other Ambulatory Visit: Payer: Self-pay

## 2024-01-24 ENCOUNTER — Other Ambulatory Visit (HOSPITAL_BASED_OUTPATIENT_CLINIC_OR_DEPARTMENT_OTHER): Payer: Self-pay

## 2024-01-30 ENCOUNTER — Other Ambulatory Visit (HOSPITAL_BASED_OUTPATIENT_CLINIC_OR_DEPARTMENT_OTHER): Payer: Self-pay

## 2024-02-03 DIAGNOSIS — R0981 Nasal congestion: Secondary | ICD-10-CM | POA: Diagnosis not present

## 2024-02-03 DIAGNOSIS — R059 Cough, unspecified: Secondary | ICD-10-CM | POA: Diagnosis not present

## 2024-02-03 DIAGNOSIS — R051 Acute cough: Secondary | ICD-10-CM | POA: Diagnosis not present

## 2024-02-03 DIAGNOSIS — R6883 Chills (without fever): Secondary | ICD-10-CM | POA: Diagnosis not present

## 2024-02-06 DIAGNOSIS — J069 Acute upper respiratory infection, unspecified: Secondary | ICD-10-CM | POA: Diagnosis not present

## 2024-02-13 ENCOUNTER — Other Ambulatory Visit (HOSPITAL_BASED_OUTPATIENT_CLINIC_OR_DEPARTMENT_OTHER): Payer: Self-pay

## 2024-02-14 ENCOUNTER — Other Ambulatory Visit (HOSPITAL_BASED_OUTPATIENT_CLINIC_OR_DEPARTMENT_OTHER): Payer: Self-pay

## 2024-02-14 MED ORDER — MOUNJARO 7.5 MG/0.5ML ~~LOC~~ SOAJ
7.5000 mg | SUBCUTANEOUS | 5 refills | Status: AC
  Filled 2024-02-14 – 2024-02-16 (×2): qty 2, 28d supply, fill #0

## 2024-02-16 ENCOUNTER — Other Ambulatory Visit (HOSPITAL_BASED_OUTPATIENT_CLINIC_OR_DEPARTMENT_OTHER): Payer: Self-pay

## 2024-02-20 ENCOUNTER — Other Ambulatory Visit (HOSPITAL_BASED_OUTPATIENT_CLINIC_OR_DEPARTMENT_OTHER): Payer: Self-pay

## 2024-02-22 DIAGNOSIS — R1032 Left lower quadrant pain: Secondary | ICD-10-CM | POA: Diagnosis not present

## 2024-02-22 DIAGNOSIS — M545 Low back pain, unspecified: Secondary | ICD-10-CM | POA: Diagnosis not present

## 2024-02-22 DIAGNOSIS — R350 Frequency of micturition: Secondary | ICD-10-CM | POA: Diagnosis not present

## 2024-03-07 ENCOUNTER — Other Ambulatory Visit (HOSPITAL_BASED_OUTPATIENT_CLINIC_OR_DEPARTMENT_OTHER): Payer: Self-pay

## 2024-03-18 DIAGNOSIS — E860 Dehydration: Secondary | ICD-10-CM | POA: Diagnosis not present

## 2024-03-18 DIAGNOSIS — Z20822 Contact with and (suspected) exposure to covid-19: Secondary | ICD-10-CM | POA: Diagnosis not present

## 2024-03-18 DIAGNOSIS — R197 Diarrhea, unspecified: Secondary | ICD-10-CM | POA: Diagnosis not present

## 2024-03-18 DIAGNOSIS — G4489 Other headache syndrome: Secondary | ICD-10-CM | POA: Diagnosis not present

## 2024-04-02 ENCOUNTER — Other Ambulatory Visit (HOSPITAL_BASED_OUTPATIENT_CLINIC_OR_DEPARTMENT_OTHER): Payer: Self-pay

## 2024-04-02 MED ORDER — MOUNJARO 10 MG/0.5ML ~~LOC~~ SOAJ
10.0000 mg | SUBCUTANEOUS | 3 refills | Status: DC
  Filled 2024-04-02 – 2024-05-06 (×3): qty 2, 28d supply, fill #0

## 2024-04-05 DIAGNOSIS — U071 COVID-19: Secondary | ICD-10-CM | POA: Diagnosis not present

## 2024-04-05 DIAGNOSIS — R051 Acute cough: Secondary | ICD-10-CM | POA: Diagnosis not present

## 2024-04-18 DIAGNOSIS — I10 Essential (primary) hypertension: Secondary | ICD-10-CM | POA: Diagnosis not present

## 2024-04-29 ENCOUNTER — Other Ambulatory Visit (HOSPITAL_BASED_OUTPATIENT_CLINIC_OR_DEPARTMENT_OTHER): Payer: Self-pay

## 2024-05-06 ENCOUNTER — Other Ambulatory Visit (HOSPITAL_BASED_OUTPATIENT_CLINIC_OR_DEPARTMENT_OTHER): Payer: Self-pay

## 2024-05-07 ENCOUNTER — Emergency Department (HOSPITAL_BASED_OUTPATIENT_CLINIC_OR_DEPARTMENT_OTHER)
Admission: EM | Admit: 2024-05-07 | Discharge: 2024-05-07 | Disposition: A | Discharge status: Home / Self Care | Attending: Emergency Medicine | Admitting: Emergency Medicine

## 2024-05-07 ENCOUNTER — Other Ambulatory Visit: Payer: Self-pay

## 2024-05-07 ENCOUNTER — Emergency Department (HOSPITAL_BASED_OUTPATIENT_CLINIC_OR_DEPARTMENT_OTHER)

## 2024-05-07 ENCOUNTER — Other Ambulatory Visit (HOSPITAL_BASED_OUTPATIENT_CLINIC_OR_DEPARTMENT_OTHER): Payer: Self-pay

## 2024-05-07 ENCOUNTER — Encounter (HOSPITAL_BASED_OUTPATIENT_CLINIC_OR_DEPARTMENT_OTHER): Payer: Self-pay | Admitting: Emergency Medicine

## 2024-05-07 DIAGNOSIS — Z7984 Long term (current) use of oral hypoglycemic drugs: Secondary | ICD-10-CM | POA: Insufficient documentation

## 2024-05-07 DIAGNOSIS — M79604 Pain in right leg: Secondary | ICD-10-CM | POA: Diagnosis not present

## 2024-05-07 DIAGNOSIS — I10 Essential (primary) hypertension: Secondary | ICD-10-CM | POA: Diagnosis not present

## 2024-05-07 DIAGNOSIS — M5431 Sciatica, right side: Secondary | ICD-10-CM | POA: Diagnosis not present

## 2024-05-07 DIAGNOSIS — M5441 Lumbago with sciatica, right side: Secondary | ICD-10-CM | POA: Diagnosis not present

## 2024-05-07 DIAGNOSIS — E119 Type 2 diabetes mellitus without complications: Secondary | ICD-10-CM | POA: Insufficient documentation

## 2024-05-07 DIAGNOSIS — R110 Nausea: Secondary | ICD-10-CM | POA: Insufficient documentation

## 2024-05-07 DIAGNOSIS — Z79899 Other long term (current) drug therapy: Secondary | ICD-10-CM | POA: Insufficient documentation

## 2024-05-07 LAB — CBC WITH DIFFERENTIAL/PLATELET
Abs Immature Granulocytes: 0.02 K/uL (ref 0.00–0.07)
Basophils Absolute: 0 K/uL (ref 0.0–0.1)
Basophils Relative: 0 %
Eosinophils Absolute: 0.3 K/uL (ref 0.0–0.5)
Eosinophils Relative: 4 %
HCT: 36.3 % (ref 36.0–46.0)
Hemoglobin: 12.3 g/dL (ref 12.0–15.0)
Immature Granulocytes: 0 %
Lymphocytes Relative: 40 %
Lymphs Abs: 3 K/uL (ref 0.7–4.0)
MCH: 28.2 pg (ref 26.0–34.0)
MCHC: 33.9 g/dL (ref 30.0–36.0)
MCV: 83.3 fL (ref 80.0–100.0)
Monocytes Absolute: 0.5 K/uL (ref 0.1–1.0)
Monocytes Relative: 7 %
Neutro Abs: 3.7 K/uL (ref 1.7–7.7)
Neutrophils Relative %: 49 %
Platelets: 292 K/uL (ref 150–400)
RBC: 4.36 MIL/uL (ref 3.87–5.11)
RDW: 13.4 % (ref 11.5–15.5)
WBC: 7.5 K/uL (ref 4.0–10.5)
nRBC: 0 % (ref 0.0–0.2)

## 2024-05-07 LAB — COMPREHENSIVE METABOLIC PANEL WITH GFR
ALT: 16 U/L (ref 0–44)
AST: 27 U/L (ref 15–41)
Albumin: 4.4 g/dL (ref 3.5–5.0)
Alkaline Phosphatase: 187 U/L — ABNORMAL HIGH (ref 38–126)
Anion gap: 11 (ref 5–15)
BUN: 9 mg/dL (ref 6–20)
CO2: 28 mmol/L (ref 22–32)
Calcium: 9.7 mg/dL (ref 8.9–10.3)
Chloride: 102 mmol/L (ref 98–111)
Creatinine, Ser: 0.69 mg/dL (ref 0.44–1.00)
GFR, Estimated: >60 mL/min (ref >60)
Glucose, Bld: 142 mg/dL — ABNORMAL HIGH (ref 70–99)
Potassium: 4.1 mmol/L (ref 3.5–5.1)
Sodium: 140 mmol/L (ref 135–145)
Total Bilirubin: 0.4 mg/dL (ref 0.0–1.2)
Total Protein: 7.9 g/dL (ref 6.5–8.1)

## 2024-05-07 LAB — CBG MONITORING, ED: Glucose-Capillary: 144 mg/dL — ABNORMAL HIGH (ref 70–99)

## 2024-05-07 MED ORDER — LIDOCAINE 5 % EX PTCH: 1.0000 | MEDICATED_PATCH | CUTANEOUS | 0 refills | Status: DC

## 2024-05-07 MED ORDER — LIDOCAINE 5 % EX PTCH
1.0000 | MEDICATED_PATCH | CUTANEOUS | 0 refills | Status: AC
  Filled 2024-05-07: qty 30, 30d supply, fill #0

## 2024-05-07 MED ORDER — GABAPENTIN 300 MG PO CAPS
ORAL_CAPSULE | ORAL | 0 refills | Status: DC
  Filled 2024-05-07: qty 30, 17d supply, fill #0

## 2024-05-07 MED ORDER — NAPROXEN 500 MG PO TABS
500.0000 mg | ORAL_TABLET | Freq: Two times a day (BID) | ORAL | 0 refills | Status: AC
  Filled 2024-05-07 (×3): qty 30, 15d supply, fill #0

## 2024-05-07 MED ORDER — GABAPENTIN 300 MG PO CAPS: 300.0000 mg | ORAL_CAPSULE | ORAL | 0 refills | Status: DC

## 2024-05-07 MED ORDER — DEXAMETHASONE 4 MG PO TABS
10.0000 mg | ORAL_TABLET | Freq: Once | ORAL | Status: AC
  Administered 2024-05-07: 10 mg via ORAL
  Filled 2024-05-07: qty 3

## 2024-05-07 MED ORDER — KETOROLAC TROMETHAMINE 60 MG/2ML IM SOLN
60.0000 mg | Freq: Once | INTRAMUSCULAR | Status: AC
  Administered 2024-05-07: 60 mg via INTRAMUSCULAR
  Filled 2024-05-07: qty 2

## 2024-05-07 MED ORDER — NAPROXEN 500 MG PO TABS: 500.0000 mg | ORAL_TABLET | Freq: Two times a day (BID) | ORAL | 0 refills | Status: DC

## 2024-05-07 NOTE — ED Provider Notes (Signed)
 [place] EMERGENCY DEPARTMENT AT [facility] Provider Note   CSN: [id number] Arrival date & time: 05/07/24  1521     Patient presents with: Leg Pain   Kiara Hobbs is a 58 y.o. female.  {Add pertinent medical, surgical, social history, OB history to HPI:32947} HPI       58 year old female with a history of diabetes, hypertension, hypercholesterolemia, GERD who presents with concern for right leg pain.  Reports that for about a week she has had pain to the back of her right thigh, at times with radiation down towards her right calf and ankle.  Describes it as a burning and numbing type of pain.  Nothing seems to make it better or worse.  It has been keeping her up at night due to the severity of pain.  No history of DVT or PE.  She has prior history of problems with sciatica but feels this is different.  She has not had any weakness, loss of control of bowel or bladder, denies any back pain.  Denies any trauma.  Denies any chest pain, shortness of breath or fever.  No history of IV drug use or cancer.  She has had problems controlling her blood sugars recently and had called her endocrinologist about it yesterday but had not heard back regarding the plan.  She did have some brief nausea.  Reports her sugars have mostly been running in the 200s despite her medications.  She is supposed to pick up an increased dose of Mounjaro  but has not done that yet.  Past Medical History:  Diagnosis Date   Diabetes mellitus    High cholesterol    Hypertension    Seasonal allergies      Prior to Admission medications   Medication Sig Start Date End Date Taking? Authorizing Provider  atorvastatin  (LIPITOR) 40 MG tablet Take 40 mg by mouth daily. 09/12/18   [provider]  fenofibrate 160 MG tablet Take 160 mg by mouth daily. 10/14/18   [provider]  fluticasone  (FLONASE ) 50 MCG/ACT nasal spray Place 2 sprays into both nostrils daily as needed for allergies. 06/12/19    [provider]  glucose blood (ONETOUCH VERIO) test strip Check blood glucose twice daily before meals 07/23/19   Adella Norris, MD  guaiFENesin-codeine 100-10 MG/5ML syrup Take 5 mLs by mouth 3 (three) times daily as needed for cough. 05/14/21   [provider]  Lancets JANETT ULTRASOFT) lancets Check blood glucose twice daily before meals 07/23/19   Adella Norris, MD  losartan  (COZAAR ) 25 MG tablet Take 25 mg by mouth daily. 04/17/21   [provider]  metFORMIN  (GLUCOPHAGE -XR) 500 MG 24 hr tablet 2 tabs by mouth twice daily with meals Patient taking differently: Take 500 mg by mouth 2 (two) times daily with a meal. 07/23/19   Adella Norris, MD  metoprolol  tartrate (LOPRESSOR ) 25 MG tablet Take 25 mg by mouth 2 (two) times daily. 05/28/21   [provider]  MOUNJARO  10 MG/0.5ML Pen Inject 10 mg into the skin once a week. 04/02/24     MOUNJARO  5 MG/0.5ML Pen Inject 5 mg into the skin once a week. 05/03/23     MOUNJARO  7.5 MG/0.5ML Pen Inject 7.5 mg into the skin once a week. 02/14/24     ONETOUCH VERIO test strip Test 3 (three) times a day. E11.65 09/21/23     pantoprazole  (PROTONIX ) 40 MG tablet Take 1 tablet (40 mg total) by mouth 2 (two) times daily. 06/01/21 06/01/22  Raenelle Coria, MD  pioglitazone  (ACTOS ) 45 MG tablet Take 45 mg by mouth daily. 05/28/21   [provider]  TRULICITY  4.5 MG/0.5ML SOPN Inject 0.5 mLs into the skin once a week. 05/08/21   [provider]    Allergies: Penicillins and Lisinopril    Review of Systems  Updated Vital Signs BP 138/85 (BP Location: Left Arm)   Pulse 95   Temp 98.4 F (36.9 C) (Oral)   Resp 16   Ht 5' 4 (1.626 m)   Wt 87.5 kg   SpO2 99%   BMI 33.13 kg/m   Physical Exam Vitals and nursing note reviewed.  Constitutional:      General: She is not in acute distress.    Appearance: She is well-developed. She is not diaphoretic.  HENT:     Head: Normocephalic and atraumatic.   Eyes:     Conjunctiva/sclera: Conjunctivae normal.  Cardiovascular:     Rate and Rhythm: Normal rate and regular rhythm.     Pulses: Normal pulses.     Heart sounds: Normal heart sounds.  Pulmonary:     Effort: Pulmonary effort is normal. No respiratory distress.     Breath sounds: Normal breath sounds.  Abdominal:     General: There is no distension.     Palpations: Abdomen is soft.  Musculoskeletal:        General: Tenderness (right posterior thigh) present.     Cervical back: Normal range of motion.     Right lower leg: No edema.     Left lower leg: No edema.  Skin:    General: Skin is warm and dry.     Findings: No erythema (no erythema) or rash.  Neurological:     Mental Status: She is alert and oriented to person, place, and time.     Motor: No weakness (normal strength and sensation LE with exception fo pain to posterior thigh).     (all labs ordered are listed, but only abnormal results are displayed) Labs Reviewed  CBC WITH DIFFERENTIAL/PLATELET  COMPREHENSIVE METABOLIC PANEL WITH GFR  CBG MONITORING, ED    EKG: None  Radiology: No results found.  {Document cardiac monitor, telemetry assessment procedure when appropriate:32947} Procedures   Medications Ordered in the ED  ketorolac  (TORADOL ) injection 60 mg (has no administration in time range)      {Click here for ABCD2, HEART and other calculators REFRESH Note before signing:1}                                58 year old female with a history of diabetes, hypertension, hypercholesterolemia, GERD who presents with concern for right leg pain.  Differential diagnosis includes radicular pain, acute arterial thrombus, DVT, HHS, DKA, other metabolic abnormalities.  She has normal bilateral lower extremity pulses and have low suspicion for acute arterial thrombus.  There are no signs of cellulitis, abscess, or septic arthritis on history or exam.  No signs of cauda equina, epidural abscess or  osteomyelitis.  DVT study obtained and shows***  Labs completed and personally right interpreted by me show very mild hyperglycemia, no signs of DKA or HHS.  No anemia or leukocytosis.  Suspect sciatica related pain. Given dose of Toradol  in the emergency department.  Given dose of decadron  with discussion of risks of hyperglycemia. ***Recommend gabapentin , naproxen  ***   {Document critical care time when appropriate  Document review of labs and clinical decision tools ie CHADS2VASC2, etc  Document your independent review of radiology images and any outside records  Document your discussion with family members, caretakers and with consultants  Document social determinants of health affecting pt's care  Document your decision making why or why not admission, treatments were needed:32947:::1}   Final diagnoses:  Right leg pain    ED Discharge Orders     None

## 2024-05-07 NOTE — ED Triage Notes (Signed)
 Pt c/o right sided leg pain with burning and numbness from top of thigh to back of knee, hx of same

## 2024-05-08 ENCOUNTER — Other Ambulatory Visit (HOSPITAL_BASED_OUTPATIENT_CLINIC_OR_DEPARTMENT_OTHER): Payer: Self-pay

## 2024-05-31 ENCOUNTER — Other Ambulatory Visit (HOSPITAL_BASED_OUTPATIENT_CLINIC_OR_DEPARTMENT_OTHER): Payer: Self-pay

## 2024-05-31 MED ORDER — MOUNJARO 12.5 MG/0.5ML ~~LOC~~ SOAJ
12.5000 mg | SUBCUTANEOUS | 5 refills | Status: DC
  Filled 2024-05-31 – 2024-06-03 (×2): qty 2, 28d supply, fill #0
  Filled 2024-07-05: qty 2, 28d supply, fill #1
  Filled 2024-07-22 – 2024-08-14 (×4): qty 2, 28d supply, fill #2
  Filled 2024-09-10: qty 2, 28d supply, fill #3

## 2024-06-03 ENCOUNTER — Other Ambulatory Visit (HOSPITAL_BASED_OUTPATIENT_CLINIC_OR_DEPARTMENT_OTHER): Payer: Self-pay

## 2024-06-05 DIAGNOSIS — Z20822 Contact with and (suspected) exposure to covid-19: Secondary | ICD-10-CM | POA: Diagnosis not present

## 2024-06-05 DIAGNOSIS — R051 Acute cough: Secondary | ICD-10-CM | POA: Diagnosis not present

## 2024-06-07 DIAGNOSIS — R0981 Nasal congestion: Secondary | ICD-10-CM | POA: Diagnosis not present

## 2024-06-07 DIAGNOSIS — Z20822 Contact with and (suspected) exposure to covid-19: Secondary | ICD-10-CM | POA: Diagnosis not present

## 2024-06-10 ENCOUNTER — Other Ambulatory Visit (HOSPITAL_BASED_OUTPATIENT_CLINIC_OR_DEPARTMENT_OTHER): Payer: Self-pay

## 2024-06-10 MED ORDER — GABAPENTIN 300 MG PO CAPS
300.0000 mg | ORAL_CAPSULE | Freq: Two times a day (BID) | ORAL | 1 refills | Status: AC
  Filled 2024-06-10: qty 180, 90d supply, fill #0

## 2024-06-20 ENCOUNTER — Other Ambulatory Visit (HOSPITAL_BASED_OUTPATIENT_CLINIC_OR_DEPARTMENT_OTHER): Payer: Self-pay

## 2024-06-29 DIAGNOSIS — H6993 Unspecified Eustachian tube disorder, bilateral: Secondary | ICD-10-CM | POA: Diagnosis not present

## 2024-06-29 DIAGNOSIS — H66003 Acute suppurative otitis media without spontaneous rupture of ear drum, bilateral: Secondary | ICD-10-CM | POA: Diagnosis not present

## 2024-07-05 ENCOUNTER — Other Ambulatory Visit (HOSPITAL_BASED_OUTPATIENT_CLINIC_OR_DEPARTMENT_OTHER): Payer: Self-pay

## 2024-07-09 DIAGNOSIS — E1165 Type 2 diabetes mellitus with hyperglycemia: Secondary | ICD-10-CM | POA: Diagnosis not present

## 2024-07-22 ENCOUNTER — Other Ambulatory Visit (HOSPITAL_BASED_OUTPATIENT_CLINIC_OR_DEPARTMENT_OTHER): Payer: Self-pay

## 2024-08-02 DIAGNOSIS — M5431 Sciatica, right side: Secondary | ICD-10-CM | POA: Diagnosis not present

## 2024-08-05 ENCOUNTER — Other Ambulatory Visit (HOSPITAL_BASED_OUTPATIENT_CLINIC_OR_DEPARTMENT_OTHER): Payer: Self-pay

## 2024-08-07 DIAGNOSIS — M5442 Lumbago with sciatica, left side: Secondary | ICD-10-CM | POA: Diagnosis not present

## 2024-08-07 DIAGNOSIS — G8929 Other chronic pain: Secondary | ICD-10-CM | POA: Diagnosis not present

## 2024-08-14 ENCOUNTER — Other Ambulatory Visit (HOSPITAL_BASED_OUTPATIENT_CLINIC_OR_DEPARTMENT_OTHER): Payer: Self-pay

## 2024-08-14 DIAGNOSIS — E1165 Type 2 diabetes mellitus with hyperglycemia: Secondary | ICD-10-CM | POA: Diagnosis not present

## 2024-08-19 ENCOUNTER — Other Ambulatory Visit (HOSPITAL_BASED_OUTPATIENT_CLINIC_OR_DEPARTMENT_OTHER): Payer: Self-pay

## 2024-08-19 DIAGNOSIS — J069 Acute upper respiratory infection, unspecified: Secondary | ICD-10-CM | POA: Diagnosis not present

## 2024-08-19 MED ORDER — SLOW-MAG 71.5-119 MG PO TBEC
143.0000 mg | DELAYED_RELEASE_TABLET | Freq: Two times a day (BID) | ORAL | 3 refills | Status: AC
  Filled 2024-08-19: qty 120, 30d supply, fill #0

## 2024-08-19 MED ORDER — IPRATROPIUM BROMIDE 0.06 % NA SOLN
NASAL | 0 refills | Status: AC
  Filled 2024-08-19: qty 15, 30d supply, fill #0

## 2024-08-19 MED ORDER — BENZONATATE 200 MG PO CAPS
200.0000 mg | ORAL_CAPSULE | Freq: Three times a day (TID) | ORAL | 0 refills | Status: AC | PRN
  Filled 2024-08-19: qty 20, 7d supply, fill #0

## 2024-08-20 ENCOUNTER — Other Ambulatory Visit (HOSPITAL_BASED_OUTPATIENT_CLINIC_OR_DEPARTMENT_OTHER): Payer: Self-pay

## 2024-08-30 ENCOUNTER — Other Ambulatory Visit (HOSPITAL_BASED_OUTPATIENT_CLINIC_OR_DEPARTMENT_OTHER): Payer: Self-pay

## 2024-09-02 ENCOUNTER — Other Ambulatory Visit (HOSPITAL_BASED_OUTPATIENT_CLINIC_OR_DEPARTMENT_OTHER): Payer: Self-pay

## 2024-09-02 MED ORDER — SLOW-MAG 71.5-119 MG PO TBEC
1.0000 | DELAYED_RELEASE_TABLET | Freq: Two times a day (BID) | ORAL | 1 refills | Status: AC
  Filled 2024-09-02: qty 120, 60d supply, fill #0

## 2024-09-02 MED ORDER — AMLODIPINE BESYLATE 2.5 MG PO TABS
2.5000 mg | ORAL_TABLET | Freq: Every day | ORAL | 11 refills | Status: AC
  Filled 2024-09-02: qty 30, 30d supply, fill #0

## 2024-09-02 MED ORDER — ATORVASTATIN CALCIUM 40 MG PO TABS
40.0000 mg | ORAL_TABLET | Freq: Every day | ORAL | 3 refills | Status: AC
  Filled 2024-09-02: qty 90, 90d supply, fill #0

## 2024-09-02 MED ORDER — PANTOPRAZOLE SODIUM 40 MG PO TBEC
40.0000 mg | DELAYED_RELEASE_TABLET | Freq: Two times a day (BID) | ORAL | 3 refills | Status: AC
  Filled 2024-09-02: qty 180, 90d supply, fill #0

## 2024-09-10 ENCOUNTER — Other Ambulatory Visit (HOSPITAL_BASED_OUTPATIENT_CLINIC_OR_DEPARTMENT_OTHER): Payer: Self-pay

## 2024-09-20 ENCOUNTER — Other Ambulatory Visit (HOSPITAL_BASED_OUTPATIENT_CLINIC_OR_DEPARTMENT_OTHER): Payer: Self-pay

## 2024-10-17 ENCOUNTER — Other Ambulatory Visit (HOSPITAL_BASED_OUTPATIENT_CLINIC_OR_DEPARTMENT_OTHER): Payer: Self-pay

## 2024-10-17 MED ORDER — IPRATROPIUM BROMIDE 0.06 % NA SOLN
2.0000 | Freq: Three times a day (TID) | NASAL | 0 refills | Status: DC | PRN
  Filled 2024-10-17: qty 15, 25d supply, fill #0

## 2024-10-17 MED ORDER — CYCLOBENZAPRINE HCL 5 MG PO TABS
5.0000 mg | ORAL_TABLET | Freq: Three times a day (TID) | ORAL | 0 refills | Status: DC
  Filled 2024-10-17: qty 30, 5d supply, fill #0

## 2024-10-18 ENCOUNTER — Other Ambulatory Visit (HOSPITAL_BASED_OUTPATIENT_CLINIC_OR_DEPARTMENT_OTHER): Payer: Self-pay

## 2024-10-18 MED ORDER — MOUNJARO 15 MG/0.5ML ~~LOC~~ SOAJ
15.0000 mg | SUBCUTANEOUS | 4 refills | Status: AC
  Filled 2024-10-18: qty 2, 28d supply, fill #0
